# Patient Record
Sex: Female | Born: 1985
Health system: Southern US, Community
[De-identification: ages and names within clinical notes are randomized; demographics above are authoritative.]

## PROBLEM LIST (undated history)

## (undated) DIAGNOSIS — T7840XA Allergy, unspecified, initial encounter: Secondary | ICD-10-CM

## (undated) DIAGNOSIS — Z803 Family history of malignant neoplasm of breast: Secondary | ICD-10-CM

## (undated) HISTORY — DX: Family history of malignant neoplasm of breast: Z80.3

## (undated) HISTORY — DX: Allergy, unspecified, initial encounter: T78.40XA

## (undated) HISTORY — PX: WISDOM TOOTH EXTRACTION: SHX21

---
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
## 2009-06-27 ENCOUNTER — Encounter: Payer: Self-pay | Admitting: Internal Medicine

## 2009-06-30 DIAGNOSIS — J45909 Unspecified asthma, uncomplicated: Secondary | ICD-10-CM

## 2010-06-13 ENCOUNTER — Telehealth (INDEPENDENT_AMBULATORY_CARE_PROVIDER_SITE_OTHER): Payer: Self-pay | Admitting: *Deleted

## 2010-10-03 NOTE — Progress Notes (Signed)
Summary: cold  Phone Note Call from Patient   Caller: Patient Call For: katie Summary of Call: Pt c/o of possible cold, runny nose, sore throat. Have taken clarinex. Initial call taken by: Darletta Moll,  June 13, 2010 2:50 PM  Follow-up for Phone Call        Per CDY-suggest patient use Neti-pot, Sudafed PE, and Mucinex OTC.Reynaldo Minium CMA  June 13, 2010 5:20 PM   Pt is aware of the recs.Reynaldo Minium CMA  June 13, 2010 5:20 PM

## 2011-07-05 ENCOUNTER — Inpatient Hospital Stay (INDEPENDENT_AMBULATORY_CARE_PROVIDER_SITE_OTHER)
Admission: RE | Admit: 2011-07-05 | Discharge: 2011-07-05 | Disposition: A | Discharge status: Home / Self Care | Payer: 59 | Source: Ambulatory Visit | Attending: Family Medicine | Admitting: Family Medicine

## 2011-07-05 DIAGNOSIS — J029 Acute pharyngitis, unspecified: Secondary | ICD-10-CM

## 2011-10-10 ENCOUNTER — Telehealth: Payer: Self-pay | Admitting: *Deleted

## 2011-10-10 MED ORDER — AMOXICILLIN-POT CLAVULANATE 875-125 MG PO TABS: 1.0000 | ORAL_TABLET | Freq: Two times a day (BID) | ORAL | Status: AC

## 2011-10-10 MED ORDER — NEOMYCIN-POLYMYXIN-HC 3.5-10000-1 OT SOLN: 3.0000 [drp] | Freq: Three times a day (TID) | OTIC | Status: AC

## 2011-10-10 NOTE — Telephone Encounter (Signed)
Per SN---ok to add pt to schedule to establish care with SN.  She is aware that SN has sent in augmentin 875mg   1 po bid #20 and ear drops for her ear infection.  And she is also aware to take the align daily while on the augmentin.  appt has been made for the pt for 2-7 at 11 and pt is aware.

## 2011-10-11 ENCOUNTER — Encounter: Payer: Self-pay | Admitting: Pulmonary Disease

## 2011-10-11 ENCOUNTER — Ambulatory Visit (INDEPENDENT_AMBULATORY_CARE_PROVIDER_SITE_OTHER): Payer: 59 | Admitting: Pulmonary Disease

## 2011-10-11 VITALS — BP 106/68 | HR 79 | Temp 97.4°F | Ht 64.0 in | Wt 141.4 lb

## 2011-10-11 DIAGNOSIS — J309 Allergic rhinitis, unspecified: Secondary | ICD-10-CM | POA: Insufficient documentation

## 2011-10-11 DIAGNOSIS — J45909 Unspecified asthma, uncomplicated: Secondary | ICD-10-CM

## 2011-10-11 DIAGNOSIS — H6690 Otitis media, unspecified, unspecified ear: Secondary | ICD-10-CM

## 2011-10-11 NOTE — Progress Notes (Signed)
Subjective:     Patient ID: Rachael [name], female   DOB: 08-17-1986, 26 y.o.   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  ~  October 11, 2011:  Rachael [name] presents w/ a recent URI characterized by upper resp congestion & drainage w/ sneezing/ blowing/ & coughing she says; treated w/ OTC meds & improved; then several days ago noted onset left ear pain, pressure, sl dizzy, then decr hearing etc; no ear drainage, no blood, denies f/c/s; sinuses sl congested as noted & she was using Zyrtek, Tylenol cold & sinus, etc;  Exam shows red inflammed bulging left TM c/w OM and we decided to treat w/ Augmentin, Cortisporin Otic, & Align...          PROBLEM LIST:    ALLERGIC RHINITIS >> on OTC Antihist as needed... ASTHMA >> on NEB w/ Xopenex as needed & PROAIR HFA as needed...  Rachael [name] has a hx of severe allergies and Asthma as a child beginning ~4y/o & she recalls mult hospitalizations & missed school;  She recalls allergy testing w/ reactions to "everything" & she is especially sens to cat dander;  She took allergy shots for 2 yrs betw [age] y/o and improved sufficiently to stop shots & regular meds around puberty;  In recent yrs she notes very little if any problems & estimates that she uses the Nebulizer once every 860mo and the Proair rescue inhaler maybe once every 60mo if she gets contact w/ an allergen or occas w/ exercise;  We discussed checking RAST/ IgE later if she would like to look into her allergy profile...  HEALTH MAINTENANCE: ~  GI:  No hx problems... ~  GYN:  [facility], good general health, takes BCPs under their direction... ~  Immunizations:  She gets the yearly Flu vaccine from [facility];  She will check her vaccination record & get a copy for [place]...   Past Medical History  Diagnosis Date  . Asthma     No past surgical history on file.   Outpatient Encounter Prescriptions as of 10/11/2011    Medication Sig Dispense Refill  . amoxicillin-clavulanate (AUGMENTIN) 875-125 MG per tablet Take 1 tablet by mouth 2 (two) times daily.  20 tablet  0  . neomycin-polymyxin-hydrocortisone (CORTISPORIN) otic solution Place 3 drops into the left ear 3 (three) times daily.  10 mL  0  . Probiotic Product (ALIGN) 4 MG CAPS Take 4 mg by mouth daily.        No Known Allergies   Family History  Problem Relation Age of Onset  . Breast cancer      both grandparents  . Diabetes      both parents    History   Social History  . Marital Status: Married    Spouse Name: Riki Rusk    Number of Children: 0  . Years of Education: N/A   Occupational History  . [employer]   Social History Main Topics  . Smoking status: Current Some Day Smoker    Types: Cigarettes  . Smokeless tobacco: Not on file  . Alcohol Use: Yes     social use  . Drug Use: No  . Sexually Active: Not on file   Other Topics Concern  . Not on file   Social History Narrative  . No narrative on file    Current Medications, Allergies, Past Medical History, Past Surgical History, Family History, and Social  History were reviewed in [place] Link electronic medical record.    Review of Systems    Constitutional:  Denies F/C/S, anorexia, unexpected weight change. HEENT:  No HA, visual changes, +earache on left, nasal symptoms, sore throat, hoarseness. Resp:  No cough, sputum, hemoptysis; no SOB, tightness, wheezing. Cardio:  No CP, palpit, DOE, orthopnea, edema. GI:  Denies N/V/D/C or blood in stool; no reflux, abd pain, distention, or gas. GU:  No dysuria, freq, urgency, hematuria, or flank pain. MS:  Denies joint pain, swelling, tenderness, or decr ROM; no neck pain, back pain, etc. Neuro:  No tremors, seizures, dizziness, syncope, weakness, numbness, gait abn. Skin:  No suspicious lesions or skin rash. Heme:  No adenopathy, bruising, bleeding. Psyche: Denies confusion, sleep disturbance,  hallucinations, anxiety, depression.   Objective:   Physical Exam    Vital Signs:  Reviewed...  General:  WD, WN, 26 y/o WF in NAD; alert & oriented; pleasant & cooperative... HEENT:  [place]/AT; Conjunctiva- pink, Sclera- nonicteric, EOM-wnl, PERRLA, Fundi-benign;  EACs- red on left, TM- left bulging red inflammed; NOSE-clear; THROAT-clear & wnl. Neck:  Supple w/ full ROM; no JVD; normal carotid impulses w/o bruits; no thyromegaly or nodules palpated; no lymphadenopathy. Chest:  Clear to P & A; without wheezes, rales, or rhonchi heard. Heart:  Regular Rhythm; norm S1 & S2 without murmurs, rubs, or gallops detected. Abdomen:  Soft & nontender- no guarding or rebound; normal bowel sounds; no organomegaly or masses palpated. Ext:  Normal ROM; without deformities or arthritic changes; no varicose veins, venous insuffic, or edema;  Pulses intact w/o bruits. Neuro:  CNs II-XII intact; motor testing normal; sensory testing normal; gait normal & balance OK. Derm:  No lesions noted; no rash etc. Lymph:  No cervical, supraclavicular, axillary, or inguinal adenopathy palpated.   RADIOLOGY DATA:  Reviewed in the EPIC EMR >> CXR 9/03 showed normal heart size, clear lungs w/ sl prom markings, mild scoliosis seen...  LABORATORY DATA:  Reviewed in the EPIC EMR >> no prev lab data avail in Epic, [facility], or [employer]...    Assessment:     OTITIS MEDIA>  Presentation & exam c/w OM & she has been started on Augmentin 875mg Bid, Cortisporin Otic drops, & Align...  ALLERGIC RHINITIS>  She does well w/ OTC Antihist prn & has not had severe allergy reaction in yrs (she knows to stay away from cats)...  ASTHMA>  She has NEBS w/ Xopenex & ProairHFA rescue but doesn't need any meds regularly and uses them Q3-36mo when needed; we reviewed further eval w/ f/u CXR, PFTs, Labs if & when needed...  Pos FamHx DM>  Both parents w/ DM (father diet controlled, mother on pills) & we discussed strategies to avoid  problems in future; Diet/ Exercise/ avoid carbs...     Plan:     Patient's Medications  New Prescriptions   No medications on file  Previous Medications   AMOXICILLIN-CLAVULANATE (AUGMENTIN) 875-125 MG PER TABLET    Take 1 tablet by mouth 2 (two) times daily.   NEOMYCIN-POLYMYXIN-HYDROCORTISONE (CORTISPORIN) OTIC SOLUTION    Place 3 drops into the left ear 3 (three) times daily.   PROBIOTIC PRODUCT (ALIGN) 4 MG CAPS    Take 4 mg by mouth daily.  Modified Medications   No medications on file  Discontinued Medications   No medications on file

## 2011-10-11 NOTE — Patient Instructions (Signed)
Today we updated your med list in our EPIC system...     We discussed the Augmentin, Cortisporin Otic, & Align therapy...  Let us know if your allergies or your asthma become a problem & you find that you are using your medications more often...  Call for any questions.Marland KitchenMarland Kitchen

## 2013-03-04 ENCOUNTER — Encounter: Payer: Self-pay | Admitting: Pulmonary Disease

## 2013-03-10 ENCOUNTER — Other Ambulatory Visit (INDEPENDENT_AMBULATORY_CARE_PROVIDER_SITE_OTHER): Payer: 59

## 2013-03-10 ENCOUNTER — Encounter: Payer: Self-pay | Admitting: Pulmonary Disease

## 2013-03-10 ENCOUNTER — Ambulatory Visit (INDEPENDENT_AMBULATORY_CARE_PROVIDER_SITE_OTHER): Payer: 59 | Admitting: Pulmonary Disease

## 2013-03-10 VITALS — BP 108/72 | HR 79 | Temp 98.7°F | Ht 64.0 in | Wt 158.2 lb

## 2013-03-10 DIAGNOSIS — R509 Fever, unspecified: Secondary | ICD-10-CM

## 2013-03-10 DIAGNOSIS — J029 Acute pharyngitis, unspecified: Secondary | ICD-10-CM

## 2013-03-10 DIAGNOSIS — J020 Streptococcal pharyngitis: Secondary | ICD-10-CM

## 2013-03-10 LAB — CBC WITH DIFFERENTIAL/PLATELET
Basophils Relative: 0.3 % (ref 0.0–3.0)
Eosinophils Absolute: 0.3 10*3/uL (ref 0.0–0.7)
Eosinophils Relative: 2.5 % (ref 0.0–5.0)
Lymphocytes Relative: 14.8 % (ref 12.0–46.0)
Neutrophils Relative %: 75.9 % (ref 43.0–77.0)
RBC: 4.4 Mil/uL (ref 3.87–5.11)
WBC: 12.2 10*3/uL — ABNORMAL HIGH (ref 4.5–10.5)

## 2013-03-10 MED ORDER — FIRST-DUKES MOUTHWASH MT SUSP: OROMUCOSAL | Status: DC

## 2013-03-10 MED ORDER — METHYLPREDNISOLONE ACETATE 80 MG/ML IJ SUSP
80.0000 mg | Freq: Once | INTRAMUSCULAR | Status: AC
  Administered 2013-03-10: 80 mg via INTRAMUSCULAR

## 2013-03-10 MED ORDER — CEPHALEXIN 500 MG PO CAPS: 500.0000 mg | ORAL_CAPSULE | Freq: Two times a day (BID) | ORAL | Status: DC

## 2013-03-10 NOTE — Progress Notes (Signed)
Subjective:     Patient ID: Rachael [name], female   DOB: April 26, 1986, 27 y.o.   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  ~  October 11, 2011:  Rachael [name] presents w/ a recent URI characterized by upper resp congestion & drainage w/ sneezing/ blowing/ & coughing she says; treated w/ OTC meds & improved; then several days ago noted onset left ear pain, pressure, sl dizzy, then decr hearing etc; no ear drainage, no blood, denies f/c/s; sinuses sl congested as noted & she was using Zyrtek, Tylenol cold & sinus, etc;  Exam shows red inflammed bulging left TM c/w OM and we decided to treat w/ Augmentin, Cortisporin Otic, & Align...  ~  March 10, 2013:  37mo ROV & add-on for acute pharyngitis> Rachael [name] was with family/ friends over the 4th of July holiday & noted onset sore throat, difficulty swallowing, & swollen glands in neck 2d ago; throat appeared red in mirror; he had Temp to 102 assoc chills & sweats;  Exam shows red throat, swollen tonsils w/ mild exud seen, pos bilat adenopathy;  Labs reveal Hg=13.3, WBC= 12,200 w/ 76% neutrophils, B-strep screen is pos...  We discussed strep throat & Rx w/ Depo80, Keflex500Bid, MMW, along w/ Chlorseptic, Tylenol/ aleve, etc...            PROBLEM LIST:    ALLERGIC RHINITIS >> on OTC Antihist as needed... ASTHMA >> on NEB w/ Xopenex as needed & PROAIR HFA as needed...  Rachael [name] has a hx of severe allergies and Asthma as a child beginning ~4y/o & she recalls mult hospitalizations & missed school;  She recalls allergy testing w/ reactions to "everything" & she is especially sens to cat dander;  She took allergy shots for 2 yrs betw [age] y/o and improved sufficiently to stop shots & regular meds around puberty;  In recent yrs she notes very little if any problems & estimates that she uses the Nebulizer once every 48mo and the Proair rescue inhaler maybe once every 63mo if she gets contact w/ an  allergen or occas w/ exercise;  We discussed checking RAST/ IgE later if she would like to look into her allergy profile...  HEALTH MAINTENANCE: ~  GI:  No hx problems... ~  GYN:  [facility], good general health, takes BCPs under their direction... ~  Immunizations:  She gets the yearly Flu vaccine from [facility];  She will check her vaccination record & get a copy for [place]...   Past Medical History  Diagnosis Date  . Asthma     History reviewed. No pertinent past surgical history.   Outpatient Encounter Prescriptions as of 03/10/2013  Medication Sig Dispense Refill  . cetirizine (ZYRTEC) 10 MG tablet Take 10 mg by mouth daily.      . cephALEXin (KEFLEX) 500 MG capsule Take 1 capsule (500 mg total) by mouth 2 (two) times daily.  14 capsule  0  . Diphenhyd-Hydrocort-Nystatin (FIRST-DUKES MOUTHWASH) SUSP 1 tsp gargle and swallow four times daily as needed  120 mL  1  . [DISCONTINUED] Probiotic Product (ALIGN) 4 MG CAPS Take 4 mg by mouth daily.      . [EXPIRED] methylPREDNISolone acetate (DEPO-MEDROL) injection 80 mg        No facility-administered encounter medications on file as of 03/10/2013.    No Known Allergies   Family History  Problem Relation Age of Onset  . Breast cancer  both grandparents  . Diabetes      both parents    History   Social History  . Marital Status: Married    Spouse Name: Riki Rusk    Number of Children: 0  . Years of Education: N/A   Occupational History  . [employer]   Social History Main Topics  . Smoking status: Current Some Day Smoker    Types: Cigarettes  . Smokeless tobacco: Not on file  . Alcohol Use: Yes     Comment: social use  . Drug Use: No  . Sexually Active: Not on file   Other Topics Concern  . Not on file   Social History Narrative  . No narrative on file    Current Medications, Allergies, Past Medical History, Past Surgical History, Family History, and Social History were reviewed in  [employer] [facility] record.    Review of Systems    Constitutional:  +F/C/S, denies anorexia, unexpected weight change. HEENT:  No HA, visual changes, nasal symptoms; +sore throat, hard to swallow, +swollen glands Resp:  No cough, sputum, hemoptysis; no SOB, tightness, wheezing. Cardio:  No CP, palpit, DOE, orthopnea, edema. GI:  Denies N/V/D/C or blood in stool; no reflux, abd pain, distention, or gas. GU:  No dysuria, freq, urgency, hematuria, or flank pain. MS:  Denies joint pain, swelling, tenderness, or decr ROM; no neck pain, back pain, etc. Neuro:  No tremors, seizures, dizziness, syncope, weakness, numbness, gait abn. Skin:  No suspicious lesions or skin rash. Heme:  No adenopathy, bruising, bleeding. Psyche: Denies confusion, sleep disturbance, hallucinations, anxiety, depression.   Objective:   Physical Exam    Vital Signs:  Reviewed...  General:  WD, WN, 27 y/o WF in NAD; alert & oriented; pleasant & cooperative... HEENT:  [place]/AT; Conjunctiva- pink, Sclera- nonicteric, EOM-wnl, PERRLA, Fundi-benign;  EACs-clear;  NOSE-clear; THROAT-red swollen tonsils w/ exud Neck:  Supple w/ full ROM; no JVD; normal carotid impulses w/o bruits; no thyromegaly or nodules palpated; +lymphadenopathy. Chest:  Clear to P & A; without wheezes, rales, or rhonchi heard. Heart:  Regular Rhythm; norm S1 & S2 without murmurs, rubs, or gallops detected. Abdomen:  Soft & nontender- no guarding or rebound; normal bowel sounds; no organomegaly or masses palpated. Ext:  Normal ROM; without deformities or arthritic changes; no varicose veins, venous insuffic, or edema;  Pulses intact w/o bruits. Neuro:  CNs II-XII intact; motor testing normal; sensory testing normal; gait normal & balance OK. Derm:  No lesions noted; no rash etc. Lymph:  No cervical, supraclavicular, axillary, or inguinal adenopathy palpated.  RADIOLOGY DATA:  Reviewed in the EPIC EMR >> CXR 9/03 showed normal heart size,  clear lungs w/ sl prom markings, mild scoliosis seen...  LABORATORY DATA:  Reviewed in the EPIC EMR >> no prev lab data avail in Epic, [employer], or [employer]...    Assessment:      STREP THROAT >> pos beta strep screen etc... Rx w/ Depo80, Keflex500Bid, MMW, and OTC Chlorseptic, Tylenol, etc...  ALLERGIC RHINITIS>  She does well w/ OTC Antihist prn & has not had severe allergy reaction in yrs (she knows to stay away from cats)...  ASTHMA>  She has NEBS w/ Xopenex & ProairHFA rescue but doesn't need any meds regularly and uses them Q3-67mo when needed; we reviewed further eval w/ f/u CXR, PFTs, Labs if & when needed...  Pos FamHx DM>  Both parents w/ DM (father diet controlled, mother on pills) & we discussed strategies to avoid problems  in future; Diet/ Exercise/ avoid carbs...     Plan:     Patient's Medications  New Prescriptions   CEPHALEXIN (KEFLEX) 500 MG CAPSULE    Take 1 capsule (500 mg total) by mouth 2 (two) times daily.   DIPHENHYD-HYDROCORT-NYSTATIN (FIRST-DUKES MOUTHWASH) SUSP    1 tsp gargle and swallow four times daily as needed  Previous Medications   CETIRIZINE (ZYRTEC) 10 MG TABLET    Take 10 mg by mouth daily.  Modified Medications   No medications on file  Discontinued Medications   PROBIOTIC PRODUCT (ALIGN) 4 MG CAPS    Take 4 mg by mouth daily.

## 2013-03-10 NOTE — Patient Instructions (Addendum)
Jenascia> your lab work indicates a strep throat...  We will treat the inflammation w/ a Depo shot... We will treat the streptococcus w/ Keflex 500mg  twice daily x7d... We will treat the sore throat w/ Magic Mouthwash- 1 tsp gargle 7 swallow up to 4 times daily as needed...  I rec that you take a probiotic like ALIGN & consider Activia yogurt while you are on the antibiotic...  You may also use Tylenol/ Aleve and chlorseptic spray as needed...  Call for any questions.Marland KitchenMarland Kitchen

## 2013-06-19 ENCOUNTER — Encounter: Payer: Self-pay | Admitting: Internal Medicine

## 2013-06-19 ENCOUNTER — Encounter: Payer: Self-pay | Admitting: *Deleted

## 2013-06-19 MED ORDER — LEVALBUTEROL HCL 0.63 MG/3ML IN NEBU: 1.0000 | INHALATION_SOLUTION | Freq: Four times a day (QID) | RESPIRATORY_TRACT | Status: DC | PRN

## 2013-06-19 NOTE — Telephone Encounter (Signed)
CY, Please advise if you are okay with sending Rx for Albuterol nebulizer for the patient. Thanks.

## 2013-06-19 NOTE — Addendum Note (Signed)
Addended by: Darrell Jewel on: 06/19/2013 03:43 PM   Modules accepted: Orders

## 2013-06-19 NOTE — Telephone Encounter (Signed)
Ok to script for new nebulizer compressor machine, and for either Xopenex 0.063 or albuterol 0.083, whichever she prefers, # 25, 1 4x daily as needed, ref prn

## 2013-06-22 ENCOUNTER — Ambulatory Visit: Payer: 59 | Admitting: Internal Medicine

## 2013-06-24 ENCOUNTER — Encounter: Payer: Self-pay | Admitting: Internal Medicine

## 2013-06-24 ENCOUNTER — Ambulatory Visit (INDEPENDENT_AMBULATORY_CARE_PROVIDER_SITE_OTHER): Payer: 59 | Admitting: Internal Medicine

## 2013-06-24 VITALS — BP 116/74 | HR 107 | Ht 64.0 in | Wt 153.0 lb

## 2013-06-24 DIAGNOSIS — J45909 Unspecified asthma, uncomplicated: Secondary | ICD-10-CM

## 2013-06-24 MED ORDER — FLUTICASONE FUROATE-VILANTEROL 100-25 MCG/INH IN AEPB: 1.0000 | INHALATION_SPRAY | Freq: Every day | RESPIRATORY_TRACT | Status: DC

## 2013-06-24 NOTE — Patient Instructions (Addendum)
Sample Breo Ellipta  Maintenance inhaler    1 puff then rinse mouth, once daily  Please try to stop smoking  Please let me know how you do with this

## 2013-06-24 NOTE — Assessment & Plan Note (Signed)
Mild intermittent astjma with exacerbation, associated with fall season, but without rhinitis or obvious reflux or infection. Plan- smoking cessation, maintenance LABA/ICS at least until season passes

## 2013-06-24 NOTE — Progress Notes (Signed)
06/24/13- 27 yoF smoker followed for mild intermittent asthma FOLLOWS FOR: last seen 2010; recent flare up x 2. For the past month, with Fall season, has had more wheezing and chest tightness with little or no rhinitis. Using nebulizer machine 45 times over the last month but rarely needing albuterol. Worse in the evening. Denies recognized postnasal drip or reflux. Smoking about one pack per month. Taking Zyrtec at night and Claritin in the morning. Not pregnant.   Parents living with no family history of asthma.  ROS-see HPI Constitutional:   No-   weight loss, night sweats, fevers, chills, fatigue, lassitude. HEENT:   No-  headaches, difficulty swallowing, tooth/dental problems, sore throat,       No-  sneezing, itching, ear ache, nasal congestion, post nasal drip,  CV:  No-   chest pain, orthopnea, PND, swelling in lower extremities, anasarca, dizziness, palpitations Resp: No-   shortness of breath with exertion or at rest.              No-   productive cough,  No non-productive cough,  No- coughing up of blood.              No-   change in color of mucus.  + wheezing.   Skin: No-   rash or lesions. GI:  No-   heartburn, indigestion, abdominal pain, nausea, vomiting, GU:  MS:  No-   joint pain or swelling.  . Neuro-     nothing unusual Psych:  No- change in mood or affect. No depression or anxiety.  No memory loss.  OBJ- Physical Exam General- Alert, Oriented, Affect-appropriate, Distress- none acute Skin- rash-none, lesions- none, excoriation- none Lymphadenopathy- none Head- atraumatic            Eyes- Gross vision intact, PERRLA, conjunctivae and secretions clear            Ears- Hearing, canals-normal            Nose- Clear, no-Septal dev, mucus, polyps, erosion, perforation             Throat- Mallampati II , mucosa clear , drainage- none, tonsils- atrophic Neck- flexible , trachea midline, no stridor , thyroid nl, carotid no bruit Chest - symmetrical excursion , unlabored        Heart/CV- RRR , no murmur , no gallop  , no rub, nl s1 s2                           - JVD- none , edema- none, stasis changes- none, varices- none           Lung- clear to P&A, wheeze- none, cough- none , dullness-none, rub- none           Chest wall-  Abd-  Br/ Gen/ Rectal- Not done, not indicated Extrem- cyanosis- none, clubbing, none, atrophy- none, strength- nl Neuro- grossly intact to observation

## 2013-09-03 NOTE — L&D Delivery Note (Signed)
Delivery Note At 4:56 AM a viable and healthy female was delivered via Vaginal, Spontaneous Delivery (Presentation: Left Occiput Anterior).  APGAR: 8, 8; weight .P   Placenta status: Intact, Spontaneous.  Cord: 3 vessels with the following complications: None.    Anesthesia: Epidural  Episiotomy: None Lacerations: 2nd degree perineal, L labial Suture Repair: 3.0 vicryl rapide Est. Blood Loss (mL): 500cc  Mom to postpartum.  Baby to Couplet care / Skin to Skin.  Bovard-Stuckert, Wajiha Versteeg 06/26/2014, 5:34 AM  Br/A+/Contra?/Tdap in PNC/RI  D/w parents r/b/a of circumcision for female infant, wish to proceed

## 2013-11-24 LAB — OB RESULTS CONSOLE GC/CHLAMYDIA
CHLAMYDIA, DNA PROBE: NEGATIVE
GC PROBE AMP, GENITAL: NEGATIVE

## 2013-11-24 LAB — OB RESULTS CONSOLE HEPATITIS B SURFACE ANTIGEN: HEP B S AG: NEGATIVE

## 2013-11-24 LAB — OB RESULTS CONSOLE RUBELLA ANTIBODY, IGM: Rubella: IMMUNE

## 2013-11-24 LAB — OB RESULTS CONSOLE RPR: RPR: NONREACTIVE

## 2013-11-24 LAB — OB RESULTS CONSOLE HIV ANTIBODY (ROUTINE TESTING): HIV: NONREACTIVE

## 2014-04-05 ENCOUNTER — Encounter: Payer: Self-pay | Admitting: Family

## 2014-04-05 ENCOUNTER — Ambulatory Visit (INDEPENDENT_AMBULATORY_CARE_PROVIDER_SITE_OTHER): Payer: 59 | Admitting: Family

## 2014-04-05 VITALS — BP 102/60 | HR 89 | Temp 98.1°F | Resp 16 | Ht 65.0 in | Wt 194.0 lb

## 2014-04-05 DIAGNOSIS — J45909 Unspecified asthma, uncomplicated: Secondary | ICD-10-CM

## 2014-04-05 NOTE — Progress Notes (Signed)
Subjective:    Patient ID: Rachael [name], female    DOB: 12-10-1985, 28 y.o.   MRN: [id number]  HPI  Ms. Tritch is a 28 yr old female who presents today to establish care. She was previously followed by Dr. Lenna Gilford.  Asthma- Reports that she has worse symptoms in spring and fall.  She use zyrtec prn.  Worse if around cats. Reports that in the last 1 week she has needed to use her rescue inhaler zero times.  Pregnancy- Due in the end of October October- and is expecting a boy.  Dr. Melba Coon is following her at  [facility].    Review of Systems  Constitutional:       + weight gain with pregnancy.  150 is her non-pregnant weight  HENT: Negative for hearing loss and rhinorrhea.   Eyes: Negative for visual disturbance.  Respiratory: Negative for cough and shortness of breath.   Cardiovascular: Negative for chest pain and leg swelling.  Gastrointestinal: Negative for nausea, vomiting and diarrhea.  Genitourinary: Negative for dysuria, hematuria and vaginal bleeding.  Musculoskeletal: Negative for arthralgias and joint swelling.  Skin: Negative for rash.  Neurological: Negative for headaches.  Hematological: Negative for adenopathy.  Psychiatric/Behavioral:       Denies depression/anxiety   Past Medical History  Diagnosis Date  . Asthma   . Allergy     History   Social History  . Marital Status: Married    Spouse Name: Ysidro Evert    Number of Children: 0  . Years of Education: N/A   Occupational History  . [employer]   Social History Main Topics  . Smoking status: Former Smoker    Types: Cigarettes  . Smokeless tobacco: Not on file  . Alcohol Use: Yes     Comment: social use  . Drug Use: No  . Sexual Activity: Not on file   Other Topics Concern  . Not on file   Social History Narrative   Lives with husband   1 dog   Expecting first child   Works at [employer]   Working on Museum/gallery exhibitions officer at [employer]    History  reviewed. No pertinent past surgical history.  Family History  Problem Relation Age of Onset  . Breast cancer      both grandparents  . Diabetes      both parents  . Cancer Maternal Grandmother     breast  . Diabetes Maternal Grandfather   . Cancer Paternal Grandmother     breast    No Known Allergies  Current Outpatient Prescriptions on File Prior to Visit  Medication Sig Dispense Refill  . cetirizine (ZYRTEC) 10 MG tablet Take 10 mg by mouth daily.      [name] levalbuterol (XOPENEX) 0.63 MG/3ML nebulizer solution Take 3 mLs (0.63 mg total) by nebulization every 6 (six) hours as needed for wheezing.  75 mL  12   No current facility-administered medications on file prior to visit.    BP 102/60  Pulse 89  Temp(Src) 98.1 F (36.7 C) (Oral)  Resp 16  Ht 5\' 5"  (1.651 m)  Wt 194 lb (87.998 kg)  BMI 32.28 kg/m2  SpO2 99%       Objective:   Physical Exam  Constitutional: She is oriented to person, place, and time. She appears well-developed and well-nourished. No distress.  HENT:  Head: Normocephalic and atraumatic.  Right Ear: Tympanic membrane and ear canal normal.  Left Ear: Tympanic membrane and ear canal  normal.  Cardiovascular: Normal rate and regular rhythm.   No murmur heard. Pulmonary/Chest: Effort normal and breath sounds normal. No respiratory distress. She has no wheezes. She has no rales. She exhibits no tenderness.  Abdominal:  gravid  Neurological: She is alert and oriented to person, place, and time.  Psychiatric: She has a normal mood and affect. Her behavior is normal. Judgment and thought content normal.          Assessment & Plan:

## 2014-04-05 NOTE — Patient Instructions (Signed)
Please schedule physical at your convenience. Welcome to [facility]!

## 2014-04-05 NOTE — Progress Notes (Signed)
Pre visit review using our clinic review tool, if applicable. No additional management support is needed unless otherwise documented below in the visit note. 

## 2014-04-05 NOTE — Assessment & Plan Note (Signed)
She is not currently on LABA/ICS.  Seems to be doing well with rare use of rescue inhaler.  Monitor.

## 2014-05-26 LAB — OB RESULTS CONSOLE GBS: GBS: NEGATIVE

## 2014-06-24 NOTE — H&P (Signed)
Rachael [name] is a 28 y.o. female G1P0 at 39+ for IOL.  Pt term andfavorable.  D/w pt POC with IOL.  +FM, no LOF, no VB, occ ctx.  Relatively uncoplicated PNC except fetal pyelectasis - stable peds to be made aware.   . Maternal Medical History:  Contractions: Frequency: irregular.   Perceived severity is moderate.    Fetal activity: Perceived fetal activity is normal.    Prenatal complications: no prenatal complications Prenatal Complications - Diabetes: none.    OB History   Grav Para Term Preterm Abortions TAB SAB Ect Mult Living   1             G1 present No abn pap No STDs  Past Medical History  Diagnosis Date  . Asthma   . Allergy    No past surgical history on file. Family History: family history includes Breast cancer in an other family member; Cancer in her maternal grandmother and paternal grandmother; Diabetes in her maternal grandfather and another family member. Social History:  reports that she has quit smoking. Her smoking use included Cigarettes. She smoked 0.00 packs per day. She does not have any smokeless tobacco history on file. She reports that she drinks alcohol. She reports that she does not use illicit drugs.CMA, married Meds PNV All NKDA   Prenatal Transfer Tool  Maternal Diabetes: No Genetic Screening: Normal Maternal Ultrasounds/Referrals: Normal Fetal Ultrasounds or other Referrals:  None Maternal Substance Abuse:  No Significant Maternal Medications:  None Significant Maternal Lab Results:  Lab values include: Group B Strep negative Other Comments:  fetal pyelectasis, stable  Review of Systems  Constitutional: Negative.   HENT: Negative.   Eyes: Negative.   Respiratory: Negative.   Cardiovascular: Negative.   Gastrointestinal: Negative.   Genitourinary: Negative.   Musculoskeletal: Negative.   Skin: Negative.   Neurological: Negative.   Psychiatric/Behavioral: Negative.       There were no vitals taken for this visit. Maternal  Exam:  Uterine Assessment: Contraction frequency is irregular.   Abdomen: Fundal height is appropriate for gestation.   Estimated fetal weight is 7.5-8.5#.   Fetal presentation: vertex  Introitus: Normal vulva. Normal vagina.  Pelvis: adequate for delivery.   Cervix: Cervix evaluated by digital exam.     Physical Exam  Constitutional: She is oriented to person, place, and time. She appears well-developed and well-nourished.  HENT:  Head: Normocephalic and atraumatic.  Cardiovascular: Normal rate and regular rhythm.   Respiratory: Effort normal and breath sounds normal. No respiratory distress. She has no wheezes.  GI: Soft. Bowel sounds are normal. She exhibits no distension. There is no tenderness.  Musculoskeletal: Normal range of motion.  Neurological: She is alert and oriented to person, place, and time.  Skin: Skin is warm and dry.  Psychiatric: She has a normal mood and affect. Her behavior is normal.    Prenatal labs: ABO, Rh:  A+ Antibody:  neg Rubella:  immune RPR:   NR HBsAg:   neg HIV:   neg GBS:   neg  Hgb 13.1/UrCx neg/GC neg/Chl neg/VI/First Tri Screen WNL/glucola 112/gbbs neg/CF neg  [place] nl anat, vtx, pyelectasis, nl anat  Tdap 03/26/14 Flu 06/02/14  Assessment/Plan:28yo G1 at 39+ for IOL given term/favorablr cervix Epidural or IV pain meds prn AROM/pitocin to sugment' Expect SVD  Bovard-Stuckert, Rachael [name] 06/24/2014, 9:36 PM

## 2014-06-25 ENCOUNTER — Inpatient Hospital Stay (HOSPITAL_COMMUNITY): Payer: 59 | Admitting: Anesthesiology

## 2014-06-25 ENCOUNTER — Encounter (HOSPITAL_COMMUNITY): Payer: 59 | Admitting: Anesthesiology

## 2014-06-25 ENCOUNTER — Inpatient Hospital Stay (HOSPITAL_COMMUNITY)
Admission: AD | Admit: 2014-06-25 | Discharge: 2014-06-28 | DRG: 775 | Disposition: A | Discharge status: Home / Self Care | Payer: 59 | Source: Ambulatory Visit | Attending: Obstetrics and Gynecology | Admitting: Obstetrics and Gynecology

## 2014-06-25 ENCOUNTER — Encounter (HOSPITAL_COMMUNITY): Payer: Self-pay | Admitting: *Deleted

## 2014-06-25 DIAGNOSIS — Z87891 Personal history of nicotine dependence: Secondary | ICD-10-CM | POA: Diagnosis not present

## 2014-06-25 DIAGNOSIS — Z833 Family history of diabetes mellitus: Secondary | ICD-10-CM | POA: Diagnosis not present

## 2014-06-25 DIAGNOSIS — Z3403 Encounter for supervision of normal first pregnancy, third trimester: Secondary | ICD-10-CM

## 2014-06-25 DIAGNOSIS — Z3A39 39 weeks gestation of pregnancy: Secondary | ICD-10-CM | POA: Diagnosis present

## 2014-06-25 DIAGNOSIS — O9989 Other specified diseases and conditions complicating pregnancy, childbirth and the puerperium: Secondary | ICD-10-CM | POA: Diagnosis present

## 2014-06-25 LAB — RPR

## 2014-06-25 LAB — CBC
HEMATOCRIT: 36.7 % (ref 36.0–46.0)
HEMOGLOBIN: 12.2 g/dL (ref 12.0–15.0)
MCH: 30.2 pg (ref 26.0–34.0)
MCHC: 33.2 g/dL (ref 30.0–36.0)
MCV: 90.8 fL (ref 78.0–100.0)
Platelets: 228 10*3/uL (ref 150–400)
RBC: 4.04 MIL/uL (ref 3.87–5.11)
RDW: 13.3 % (ref 11.5–15.5)
WBC: 10.2 10*3/uL (ref 4.0–10.5)

## 2014-06-25 LAB — TYPE AND SCREEN
ABO/RH(D): A POS
Antibody Screen: NEGATIVE

## 2014-06-25 LAB — ABO/RH: ABO/RH(D): A POS

## 2014-06-25 MED ORDER — TERBUTALINE SULFATE 1 MG/ML IJ SOLN: 0.2500 mg | Freq: Once | INTRAMUSCULAR | Status: AC | PRN

## 2014-06-25 MED ORDER — CITRIC ACID-SODIUM CITRATE 334-500 MG/5ML PO SOLN: 30.0000 mL | ORAL | Status: DC | PRN

## 2014-06-25 MED ORDER — LACTATED RINGERS IV SOLN
500.0000 mL | Freq: Once | INTRAVENOUS | Status: AC
  Administered 2014-06-25: 500 mL via INTRAVENOUS

## 2014-06-25 MED ORDER — LIDOCAINE HCL (PF) 1 % IJ SOLN
30.0000 mL | INTRAMUSCULAR | Status: AC | PRN
  Administered 2014-06-25 (×2): 5 mL via SUBCUTANEOUS

## 2014-06-25 MED ORDER — OXYTOCIN 40 UNITS IN LACTATED RINGERS INFUSION - SIMPLE MED
62.5000 mL/h | INTRAVENOUS | Status: DC
  Administered 2014-06-26: 62.5 mL/h via INTRAVENOUS

## 2014-06-25 MED ORDER — OXYTOCIN 40 UNITS IN LACTATED RINGERS INFUSION - SIMPLE MED
1.0000 m[IU]/min | INTRAVENOUS | Status: DC
  Administered 2014-06-25: 2 m[IU]/min via INTRAVENOUS
  Filled 2014-06-25: qty 1000

## 2014-06-25 MED ORDER — DIPHENHYDRAMINE HCL 50 MG/ML IJ SOLN: 12.5000 mg | INTRAMUSCULAR | Status: DC | PRN

## 2014-06-25 MED ORDER — LACTATED RINGERS IV SOLN
INTRAVENOUS | Status: DC
  Administered 2014-06-25: 09:00:00 via INTRAVENOUS

## 2014-06-25 MED ORDER — EPHEDRINE 5 MG/ML INJ
10.0000 mg | INTRAVENOUS | Status: DC | PRN
  Filled 2014-06-25: qty 2

## 2014-06-25 MED ORDER — BUTORPHANOL TARTRATE 1 MG/ML IJ SOLN: 1.0000 mg | INTRAMUSCULAR | Status: DC | PRN

## 2014-06-25 MED ORDER — LACTATED RINGERS IV SOLN: 500.0000 mL | INTRAVENOUS | Status: DC | PRN

## 2014-06-25 MED ORDER — PHENYLEPHRINE 40 MCG/ML (10ML) SYRINGE FOR IV PUSH (FOR BLOOD PRESSURE SUPPORT)
80.0000 ug | PREFILLED_SYRINGE | INTRAVENOUS | Status: DC | PRN
  Filled 2014-06-25: qty 2
  Filled 2014-06-25: qty 10

## 2014-06-25 MED ORDER — OXYCODONE-ACETAMINOPHEN 5-325 MG PO TABS: 2.0000 | ORAL_TABLET | ORAL | Status: DC | PRN

## 2014-06-25 MED ORDER — ACETAMINOPHEN 325 MG PO TABS: 650.0000 mg | ORAL_TABLET | ORAL | Status: DC | PRN

## 2014-06-25 MED ORDER — OXYCODONE-ACETAMINOPHEN 5-325 MG PO TABS: 1.0000 | ORAL_TABLET | ORAL | Status: DC | PRN

## 2014-06-25 MED ORDER — FENTANYL 2.5 MCG/ML BUPIVACAINE 1/10 % EPIDURAL INFUSION (WH - ANES)
14.0000 mL/h | INTRAMUSCULAR | Status: DC | PRN
  Administered 2014-06-25 – 2014-06-26 (×2): 14 mL/h via EPIDURAL
  Filled 2014-06-25 (×2): qty 125

## 2014-06-25 MED ORDER — ONDANSETRON HCL 4 MG/2ML IJ SOLN: 4.0000 mg | Freq: Four times a day (QID) | INTRAMUSCULAR | Status: DC | PRN

## 2014-06-25 MED ORDER — PHENYLEPHRINE 40 MCG/ML (10ML) SYRINGE FOR IV PUSH (FOR BLOOD PRESSURE SUPPORT)
80.0000 ug | PREFILLED_SYRINGE | INTRAVENOUS | Status: DC | PRN
  Filled 2014-06-25: qty 2

## 2014-06-25 MED ORDER — OXYTOCIN BOLUS FROM INFUSION: 500.0000 mL | INTRAVENOUS | Status: DC

## 2014-06-25 NOTE — Progress Notes (Signed)
Patient ID: Rachael [name], female   DOB: 03/29/1986, 28 y.o.   MRN: [id number]  Comfortable with epidural, no c/o's  AFVSS gen NAD FHTs 125, good var, category 1 toco q45min, irr  SVE 6/90/0  Possible augmentation with pitocin.

## 2014-06-25 NOTE — Anesthesia Procedure Notes (Signed)
Epidural Patient location during procedure: OB Start time: 06/25/2014 8:56 PM End time: 06/25/2014 9:08 PM  Staffing Anesthesiologist: Takia Runyon, CHRIS Performed by: anesthesiologist   Preanesthetic Checklist Completed: patient identified, surgical consent, pre-op evaluation, timeout performed, IV checked, risks and benefits discussed and monitors and equipment checked  Epidural Patient position: sitting Prep: site prepped and draped and DuraPrep Patient monitoring: heart rate, cardiac monitor, continuous pulse ox and blood pressure Approach: midline Location: L4-L5 Injection technique: LOR saline  Needle:  Needle type: Tuohy  Needle gauge: 17 G Needle length: 9 cm Needle insertion depth: 7 cm Catheter type: closed end flexible Catheter size: 19 Gauge Catheter at skin depth: 13 cm Test dose: Other  Assessment Events: blood not aspirated, injection not painful, no injection resistance, negative IV test and no paresthesia  Additional Notes H+P and labs checked, risks and benefits discussed with the patient, consent obtained, procedure tolerated well and without complications.  Reason for block:procedure for pain

## 2014-06-25 NOTE — Progress Notes (Signed)
Patient ID: Rachael [name], female   DOB: 08/16/1986, 28 y.o.   MRN: [id number]  Getting uncomfortable with ctx.  +FM  AFVSS  gen NAD FHTs 125 R, category 1, + scalp stim q 53min  SVE 5/90/0-+1  Continue current mgmt

## 2014-06-25 NOTE — Progress Notes (Signed)
Patient ID: Rachael [name], female   DOB: 1986/06/28, 28 y.o.   MRN: [id number]  Comfortable.  No c/o's  AFVSS gen NAD FHT's 140 good var, category 1 toco occ  AROM for clear fluid w/o diff/comp  SVE 4/80/-1  Continue current mgmt

## 2014-06-25 NOTE — Anesthesia Preprocedure Evaluation (Signed)
Anesthesia Evaluation  Patient identified by MRN, date of birth, ID band Patient awake    Reviewed: Allergy & Precautions, H&P , NPO status , Patient's Chart, lab work & pertinent test results  History of Anesthesia Complications Negative for: history of anesthetic complications  Airway Mallampati: III TM Distance: >3 FB Neck ROM: Full    Dental  (+) Teeth Intact   Pulmonary neg shortness of breath, asthma , neg recent URI, former smoker,          Cardiovascular negative cardio ROS  Rhythm:Regular     Neuro/Psych negative neurological ROS  negative psych ROS   GI/Hepatic negative GI ROS, Neg liver ROS,   Endo/Other  negative endocrine ROS  Renal/GU negative Renal ROS     Musculoskeletal   Abdominal   Peds  Hematology negative hematology ROS (+)   Anesthesia Other Findings   Reproductive/Obstetrics (+) Pregnancy                           Anesthesia Physical Anesthesia Plan  ASA: II  Anesthesia Plan: Epidural   Post-op Pain Management:    Induction:   Airway Management Planned:   Additional Equipment:   Intra-op Plan:   Post-operative Plan:   Informed Consent: I have reviewed the patients History and Physical, chart, labs and discussed the procedure including the risks, benefits and alternatives for the proposed anesthesia with the patient or authorized representative who has indicated his/her understanding and acceptance.     Plan Discussed with: Anesthesiologist  Anesthesia Plan Comments:         Anesthesia Quick Evaluation

## 2014-06-26 ENCOUNTER — Encounter (HOSPITAL_COMMUNITY): Payer: Self-pay | Admitting: Obstetrics and Gynecology

## 2014-06-26 MED ORDER — BENZOCAINE-MENTHOL 20-0.5 % EX AERO: 1.0000 "application " | INHALATION_SPRAY | CUTANEOUS | Status: DC | PRN

## 2014-06-26 MED ORDER — SENNOSIDES-DOCUSATE SODIUM 8.6-50 MG PO TABS
2.0000 | ORAL_TABLET | ORAL | Status: DC
  Administered 2014-06-27 (×2): 2 via ORAL
  Filled 2014-06-26 (×2): qty 2

## 2014-06-26 MED ORDER — ONDANSETRON HCL 4 MG/2ML IJ SOLN: 4.0000 mg | INTRAMUSCULAR | Status: DC | PRN

## 2014-06-26 MED ORDER — LIDOCAINE HCL (PF) 1 % IJ SOLN
INTRAMUSCULAR | Status: AC
  Administered 2014-06-26: 30 mL
  Filled 2014-06-26: qty 30

## 2014-06-26 MED ORDER — ZOLPIDEM TARTRATE 5 MG PO TABS: 5.0000 mg | ORAL_TABLET | Freq: Every evening | ORAL | Status: DC | PRN

## 2014-06-26 MED ORDER — LACTATED RINGERS IV SOLN: INTRAVENOUS | Status: DC

## 2014-06-26 MED ORDER — LANOLIN HYDROUS EX OINT: TOPICAL_OINTMENT | CUTANEOUS | Status: DC | PRN

## 2014-06-26 MED ORDER — IBUPROFEN 600 MG PO TABS
600.0000 mg | ORAL_TABLET | Freq: Four times a day (QID) | ORAL | Status: DC
  Administered 2014-06-26 – 2014-06-28 (×10): 600 mg via ORAL
  Filled 2014-06-26 (×10): qty 1

## 2014-06-26 MED ORDER — WITCH HAZEL-GLYCERIN EX PADS: 1.0000 "application " | MEDICATED_PAD | CUTANEOUS | Status: DC | PRN

## 2014-06-26 MED ORDER — DIBUCAINE 1 % RE OINT
1.0000 | TOPICAL_OINTMENT | RECTAL | Status: DC | PRN
Start: 2014-06-26 — End: 2014-06-28

## 2014-06-26 MED ORDER — PRENATAL MULTIVITAMIN CH
1.0000 | ORAL_TABLET | Freq: Every day | ORAL | Status: DC
  Administered 2014-06-26 – 2014-06-28 (×3): 1 via ORAL
  Filled 2014-06-26 (×3): qty 1

## 2014-06-26 MED ORDER — ALBUTEROL SULFATE (2.5 MG/3ML) 0.083% IN NEBU: 3.0000 mL | INHALATION_SOLUTION | Freq: Four times a day (QID) | RESPIRATORY_TRACT | Status: DC | PRN

## 2014-06-26 MED ORDER — SIMETHICONE 80 MG PO CHEW: 80.0000 mg | CHEWABLE_TABLET | ORAL | Status: DC | PRN

## 2014-06-26 MED ORDER — OXYCODONE-ACETAMINOPHEN 5-325 MG PO TABS
1.0000 | ORAL_TABLET | ORAL | Status: DC | PRN
Start: 2014-06-26 — End: 2014-06-28

## 2014-06-26 MED ORDER — LIDOCAINE HCL 1 % IJ SOLN
30.0000 mL | Freq: Once | INTRAMUSCULAR | Status: DC
  Filled 2014-06-26: qty 30

## 2014-06-26 MED ORDER — ONDANSETRON HCL 4 MG PO TABS: 4.0000 mg | ORAL_TABLET | ORAL | Status: DC | PRN

## 2014-06-26 MED ORDER — OXYCODONE-ACETAMINOPHEN 5-325 MG PO TABS: 2.0000 | ORAL_TABLET | ORAL | Status: DC | PRN

## 2014-06-26 MED ORDER — DIPHENHYDRAMINE HCL 25 MG PO CAPS: 25.0000 mg | ORAL_CAPSULE | Freq: Four times a day (QID) | ORAL | Status: DC | PRN

## 2014-06-26 MED ORDER — LORATADINE 10 MG PO TABS
10.0000 mg | ORAL_TABLET | Freq: Every day | ORAL | Status: DC
  Administered 2014-06-28: 10 mg via ORAL
  Filled 2014-06-26 (×3): qty 1

## 2014-06-26 NOTE — Progress Notes (Signed)
Post Partum Day 0 Subjective: no complaints, up ad lib, tolerating PO and nl lochia, pain controlled  Objective: Blood pressure 103/69, pulse 88, temperature 98.6 F (37 C), temperature source Oral, resp. rate 18, height 5\' 4"  (1.626 m), weight 93.895 kg (207 lb), SpO2 98.00%, unknown if currently breastfeeding.  Physical Exam:  General: alert and no distress Lochia: appropriate Uterine Fundus: firm  Recent Labs  06/25/14 0915  HGB 12.2  HCT 36.7    Assessment/Plan: Breastfeeding and Lactation consult.  Routine care.     LOS: 1 day   Bovard-Stuckert, Lydia Toren 06/26/2014, 8:12 AM

## 2014-06-26 NOTE — Lactation Note (Signed)
This note was copied from the chart of [place]. Lactation Consultation Note  Patient Name: Rachael [name] DEYCX'K Date: 06/26/2014 Reason for consult: Initial assessment of this mom and baby at 17 hours postpartum.  Parents attended prenatal BF class at [facility] and mom states she has been shown how and is able to express colostrum as needed.  Baby asleep in open crib but has been STS frequently today.  Baby has had a void and several stools but only one sustained feeding with a LATCH score=6 due to both mom and baby needing assistance to latch and no swallows noted.  LC encouraged frequent STS and cue feedings, with use of hand pump or hand expression prior to latch attempts.  Mom encouraged to feed baby 8-12 times/24 hours and with feeding cues. LC encouraged review of Baby and Me pp 9, 14 and 20-25 for STS and BF information. LC provided [employer] brochure and reviewed [facility] services and list of community and web site resources.    Maternal Data Formula Feeding for Exclusion: No Has patient been taught Hand Expression?: Yes (documented by RN at several feedings today and mom says she is able to express some drops) Does the patient have breastfeeding experience prior to this delivery?: No (parents did attend [place] BF classes)  Feeding    LATCH Score/Interventions         LATCH scores of 5/6 due to need for latch assistance and mom with flat nipples (nurse has provided shells and hand pump)             Lactation Tools Discussed/Used   STS, hand expression, cue feedings, pre-pumping with hand pump  Consult Status Consult Status: Follow-up Date: 06/27/14 Follow-up type: In-patient    Junious Dresser [facility] 06/26/2014, 10:14 PM

## 2014-06-27 LAB — CBC
HCT: 30.3 % — ABNORMAL LOW (ref 36.0–46.0)
HEMOGLOBIN: 10.3 g/dL — AB (ref 12.0–15.0)
MCH: 30.8 pg (ref 26.0–34.0)
MCHC: 34 g/dL (ref 30.0–36.0)
MCV: 90.7 fL (ref 78.0–100.0)
Platelets: 176 10*3/uL (ref 150–400)
RBC: 3.34 MIL/uL — ABNORMAL LOW (ref 3.87–5.11)
RDW: 13.4 % (ref 11.5–15.5)
WBC: 10.6 10*3/uL — ABNORMAL HIGH (ref 4.0–10.5)

## 2014-06-27 NOTE — Progress Notes (Signed)
Post Partum Day 1 Subjective: no complaints, up ad lib, tolerating PO and nl lochia, pain controlled  Objective: Blood pressure 108/76, pulse 89, temperature 98.3 F (36.8 C), temperature source Oral, resp. rate 18, height 5\' 4"  (1.626 m), weight 93.895 kg (207 lb), SpO2 99.00%, unknown if currently breastfeeding.  Physical Exam:  General: alert and no distress Lochia: appropriate Uterine Fundus: firm   Recent Labs  06/25/14 0915 06/27/14 0603  HGB 12.2 10.3*  HCT 36.7 30.3*    Assessment/Plan: Plan for discharge tomorrow, Breastfeeding and Lactation consult.  Routine care.     LOS: 2 days   Rachael [name] 06/27/2014, 7:09 AM

## 2014-06-27 NOTE — Anesthesia Postprocedure Evaluation (Signed)
  Anesthesia Post-op Note  Patient: Rachael [name]  Procedure(s) Performed: * No procedures listed *  Patient Location: Mother/Baby  Anesthesia Type:Epidural  Level of Consciousness: awake and alert   Airway and Oxygen Therapy: Patient Spontanous Breathing  Post-op Pain: mild  Post-op Assessment: Post-op Vital signs reviewed, Patient's Cardiovascular Status Stable, Respiratory Function Stable, No signs of Nausea or vomiting, Pain level controlled, No headache, No residual numbness and No residual motor weakness  Post-op Vital Signs: Reviewed  Last Vitals:  Filed Vitals:   06/27/14 0536  BP: 108/76  Pulse: 89  Temp: 36.8 C  Resp:     Complications: No apparent anesthesia complications

## 2014-06-27 NOTE — Lactation Note (Signed)
This note was copied from the chart of [place]. Lactation Consultation Note  Mom states baby hold nipple in his mouth but doesn't suck.  Mom is pumping with DEBP and supplemented with formula.  Mom has volume parameters.  Instructed to call for assist with feeding cues.  Baby just finished formula supplementation.  Patient Name: Rachael [name]'G Date: 06/27/2014     Maternal Data    Feeding Feeding Type: Bottle Fed - Formula  LATCH Score/Interventions                      Lactation Tools Discussed/Used     Consult Status      Ave Filter 06/27/2014, 3:33 PM

## 2014-06-28 MED ORDER — PRENATAL MULTIVITAMIN CH: 1.0000 | ORAL_TABLET | Freq: Every day | ORAL | Status: DC

## 2014-06-28 MED ORDER — IBUPROFEN 800 MG PO TABS: 800.0000 mg | ORAL_TABLET | Freq: Three times a day (TID) | ORAL | Status: DC | PRN

## 2014-06-28 MED ORDER — OXYCODONE-ACETAMINOPHEN 5-325 MG PO TABS: 1.0000 | ORAL_TABLET | ORAL | Status: DC | PRN

## 2014-06-28 NOTE — Progress Notes (Signed)
Post Partum Day 2 Subjective: no complaints, up ad lib, voiding, tolerating PO and nl lochia, pain controlled  Objective: Blood pressure 118/85, pulse 82, temperature 98 F (36.7 C), temperature source Oral, resp. rate 18, height 5\' 4"  (1.626 m), weight 93.895 kg (207 lb), SpO2 100.00%, unknown if currently breastfeeding.  Physical Exam:  General: alert and no distress Lochia: appropriate Uterine Fundus: firm   Recent Labs  06/25/14 0915 06/27/14 0603  HGB 12.2 10.3*  HCT 36.7 30.3*    Assessment/Plan: Discharge home, Breastfeeding and Lactation consult.  Routine care - d/c with motrin, percocet and PNV.  F/u 6 weeks   LOS: 3 days   [name], Rachael Robbs 06/28/2014, 7:53 AM

## 2014-06-28 NOTE — Discharge Summary (Addendum)
Obstetric Discharge Summary Reason for Admission: induction of labor Prenatal Procedures: none Intrapartum Procedures: spontaneous vaginal delivery Postpartum Procedures: none Complications-Operative and Postpartum: 2nd degree perineal laceration, L labial Hemoglobin  Date Value Ref Range Status  06/27/2014 10.3* 12.0 - 15.0 g/dL Final     HCT  Date Value Ref Range Status  06/27/2014 30.3* 36.0 - 46.0 % Final    Physical Exam:  General: alert and no distress Lochia: appropriate Uterine Fundus: firm  Discharge Diagnoses: Term Pregnancy-delivered  Discharge Information: Date: 06/28/2014 Activity: pelvic rest Diet: routine Medications: PNV, Ibuprofen and Percocet Condition: stable Instructions: refer to practice specific booklet Discharge to: home Follow-up Information   Follow up with Bovard-Stuckert, Orlandus Borowski, MD. Schedule an appointment as soon as possible for a visit in 6 weeks. (routine postpartum check)    Specialty:  Obstetrics and Gynecology   Contact information:   [address] [address] [phone]       Newborn Data: Live born female  Birth Weight: 8 lb 2 oz (3685 g) APGAR: 9, 9  Home with mother.  Bovard-Stuckert, Sylvester Salonga 06/28/2014, 8:29 AM

## 2014-06-28 NOTE — Lactation Note (Signed)
This note was copied from the chart of [place]. Lactation Consultation Note RN reported baby was unable to latch and mom was supplementing w/bottle/formula. Mom hasn't worn her shells that was given to her. Spoke w/mom, mom states baby tongue thrust and will not stay latched and that she doesn't have anything really to give the baby from the breast. States when pumping to pull nipples out nothing comes out of breast. Massage breast, then hand expression demonstrated, noted small drop of colostrum noted, encouraged to rub on nipples. Explained to mom that she needs to roll nipples in her fingers and pull them out before latching. Demonstrated and applied shells inside of bra to help evert nipples more. Mom had just finished giving baby a bottle and baby was sound a sleep. Encouraged mom to call for [facility] for next feeding to assist in latching baby to breast. Discussed positioning, STS, stimulation to breast, colostrum and consistency.  Patient Name: Boy Marylouise Mallet TKPTW'S Date: 06/28/2014 Reason for consult: Difficult latch   Maternal Data    Feeding    LATCH Score/Interventions    Intervention(s): Skin to skin;Hand expression  Type of Nipple: Everted at rest and after stimulation (must roll and pull out or they are flat.) Intervention(s): Shells;Hand pump  Comfort (Breast/Nipple): Soft / non-tender           Lactation Tools Discussed/Used     Consult Status Consult Status: Follow-up Date: 06/28/14 Follow-up type: In-patient    Lorella Gomez, Elta [place] 06/28/2014, 3:07 AM

## 2014-06-28 NOTE — Lactation Note (Signed)
This note was copied from the chart of [place]. Lactation Consultation Note  Patient Name: Boy Keyundra Fant YSAYT'K Date: 06/28/2014 Reason for consult: Follow-up assessment;Difficult latch Mom reports baby is not latching to breast. Mom has been supplementing with formula, pumping occasionally.  Mom had started giving supplement just prior to [facility] visit. Baby sleepy but will wake. LC attempted to help baby latch at breast but baby could not sustain the latch. This baby sucks his upper lip, his upper lip will not stay flanged at the breast. He sucks his tongue and thrusts his tongue with initial suck exam but with suckling will develop a better suckling rhythm. Initiated #20 nipple shield and after few attempts baby did suckle few times but was not obtaining good depth and baby too sleepy, would not work with LC/Mom. [place] reviewed with Mom how to assess for deep latch using nipple shield if she tries the NS at home. Plan discussed with Mom is to try BF if baby will latch, look for breast milk in the nipple shield at the end of the feeding. Demonstrated how to pre-load nipple shield with curved tipped syringe to help with latch. Pump every 3 hours to encourage milk production, prevent engorgement and protect milk supply. Mom plans to purchase pump at d/c today. Encouraged to continue to supplement following supplemental guidelines per hours of age given to Mom. If baby does latch advised to keep at the breast for 15-20 minutes each breast if possible, but advised Mom not to worry, if she will protect her milk supply we can work on latch. OP f/u scheduled for Wednesday, 06/30/14 at 0900. Engorgement care reviewed if needed.   Maternal Data    Feeding Feeding Type: Breast Fed  LATCH Score/Interventions Latch: Repeated attempts needed to sustain latch, nipple held in mouth throughout feeding, stimulation needed to elicit sucking reflex. (initiated #20 nipple shield) Intervention(s): Adjust  position;Assist with latch;Breast massage;Breast compression  Audible Swallowing: None  Type of Nipple: Flat Intervention(s): Hand pump;Shells  Comfort (Breast/Nipple): Soft / non-tender     Hold (Positioning): Assistance needed to correctly position infant at breast and maintain latch. Intervention(s): Breastfeeding basics reviewed;Support Pillows;Position options;Skin to skin  LATCH Score: 5  Lactation Tools Discussed/Used     Consult Status Consult Status: Complete Date: 06/28/14 Follow-up type: In-patient    Katrine Coho 06/28/2014, 12:49 PM

## 2014-06-30 ENCOUNTER — Ambulatory Visit (HOSPITAL_COMMUNITY)
Admit: 2014-06-30 | Discharge: 2014-06-30 | Disposition: A | Discharge status: Home / Self Care | Payer: 59 | Attending: Obstetrics and Gynecology | Admitting: Obstetrics and Gynecology

## 2014-06-30 ENCOUNTER — Encounter: Payer: Self-pay | Admitting: Medical

## 2014-06-30 ENCOUNTER — Ambulatory Visit (INDEPENDENT_AMBULATORY_CARE_PROVIDER_SITE_OTHER): Payer: 59 | Admitting: Medical

## 2014-06-30 VITALS — BP 123/83 | HR 87 | Temp 98.4°F

## 2014-06-30 DIAGNOSIS — H6990 Unspecified Eustachian tube disorder, unspecified ear: Secondary | ICD-10-CM | POA: Insufficient documentation

## 2014-06-30 DIAGNOSIS — H6980 Other specified disorders of Eustachian tube, unspecified ear: Secondary | ICD-10-CM | POA: Insufficient documentation

## 2014-06-30 DIAGNOSIS — H6982 Other specified disorders of Eustachian tube, left ear: Secondary | ICD-10-CM

## 2014-06-30 DIAGNOSIS — H9202 Otalgia, left ear: Secondary | ICD-10-CM | POA: Insufficient documentation

## 2014-06-30 MED ORDER — FLUTICASONE PROPIONATE 50 MCG/ACT NA SUSP: 2.0000 | Freq: Every day | NASAL | Status: DC

## 2014-06-30 MED ORDER — CEFDINIR 300 MG PO CAPS: 300.0000 mg | ORAL_CAPSULE | Freq: Two times a day (BID) | ORAL | Status: DC

## 2014-06-30 NOTE — Assessment & Plan Note (Signed)
This may be only eustachian tube dysfunction. Pain she is describing seems a little bit out of character for only eustachian tube dysfunction. If no response to fluticasone and worsening toward weekend then did make antibiotic available for possible early om. But advised pt not to use antibiotic unless worsens as advised.

## 2014-06-30 NOTE — Patient Instructions (Addendum)
You appear to have early allergies vs uri with eustachian tube dysfunction. For your nasal congestion you could use either netty pot or fluticasone nasal spray(Rx advisement given). If your ear pain does not improve then start cefdinir.   Follow up in 7 days or as needed.  Rx advisement of flonase given. If uses only use for brief 7 day course. Wanted to decrease chance of antibiotic use. She may have only eustachian tube dysfunction but she is reporting significant pain in her ear. So I wanted to see if brief course flonase  would decrease pressure in her ear tube and see if can get by without antibiotic. Risk and benefits of meds discussed with pt.

## 2014-06-30 NOTE — Assessment & Plan Note (Signed)
This may be present along with allergic rhinitis. Did rx fluticasone.

## 2014-06-30 NOTE — Progress Notes (Signed)
Pre visit review using our clinic review tool, if applicable. No additional management support is needed unless otherwise documented below in the visit note. 

## 2014-06-30 NOTE — Assessment & Plan Note (Addendum)
Rx of fluticasone.

## 2014-06-30 NOTE — Lactation Note (Signed)
Lactation Consult  Mother's reason for visit:  Baby won't latch  Visit Type:  Feeding assessment /difficult latch  Appointment Notes: confirmed  Consult:  Initial Lactation Consultant:  Rachael [name]  ________________________________________________________________________ Rachael [name] Name: Rachael [name]  Date of Birth: 06/26/2013  Pediatrician: Dr. Carlis [name] at [facility] pedis  Gender: female  Gestational Age: [redacted]w[redacted]d (At Birth)  Birth Weight: 8 lb 2 oz (3685 g)  Weight at Discharge: Weight: 7 lb 9 oz (3430 g) Date of Discharge: 06/28/2014  Filed Weights   06/26/14 0456 06/27/14 0000 06/28/14 0000  Weight: 8 lb 2 oz (3685 g) 7 lb 12.9 oz (3540 g) 7 lb 9 oz (3430 g)  Last weight taken from location outside of [facility]: 7-9 oz  Location:Hospital D/C in hospital , per mom F/U at Dr. Anell Barr is today at 1100 am. Weight today: 7-9.4 oz 3440 g    ________________________________________________________________________  Mother's Name: Rachael [name] Type of delivery:  Vaginal delivery  Breastfeeding Experience: 1st baby , difficult with latching  Maternal Medical Conditions:  Asthma  Maternal Medications:  PNV   ________________________________________________________________________  Breastfeeding History (Post Discharge)- per mom has tried different things , Nipple shield wasn't working,  latching attempts but baby won't stay latched. Milk came in full force last evening, but no engorgement as of yet .   Frequency of breastfeeding: attempting to latching 1st, no consistent with latching feeding a bottle. ( Rachael [name] )  Duration of feeding: 5-7 minutes  PUMPING - With a DEBP Medela , every 3-4 hours with 1-2 oz of EBM yield  Supplementing - with EBM and if not available formula ( Similiac 19 cal )    Infant Intake and Output Assessment  Voids:  8 or more  in 24 hrs.  Color:  Clear yellow Stools:  2 or more  in 24 hrs.  Color:  Owens [name] ( per mom has not stooled since yesterday Tuesday 10  /27 at 76 n ) , seems gasey   ________________________________________________________________________  Maternal Breast Assessment  Breast:  Full )( boarder line engorged lateral aspects )  Nipple:  Flat ( semi compressible areolas )  Pain level:  2 Pain interventions:  Expressed breast milk  _______________________________________________________________________ Feeding Assessment/Evaluation  Initial feeding assessment:  Infant's oral assessment:  Variance- noted a short labia frenulum , able to stretch upper lip with latch at the breast and with a Rachael [name] bottle well.                                                             Small indentation noted on the anterior midline of the tongue. Baby able to stretch his tongue over the gum line                                             Checked the baby's suck pattern with a gloved finger , baby has a tight seal on gloved finger , slight humping of the posterior  Portion of the tongue with sucking pattern. Also noted a high palate. ( due to moms flat nipples , has made the latch difficult.   Positioning:  Football Right breast  LATCH documentation:  Latch:  2 = Grasps breast easily, tongue down, lips flanged, rhythmical sucking.  Audible swallowing:  2 = Spontaneous and intermittent  Type of nipple:  1 = Flat ( semi compressible areola , ( released some milk and latch improved with depth )   Comfort (Breast/Nipple):  1 = Filling, red/small blisters or bruises, mild/mod discomfort  Hold (Positioning):  1 = Assistance needed to correctly position infant at breast and maintain latch  LATCH score:  7  Even though latch score was  A 7 , Baby sustained a deep latch with flanged lips for 10 mins  Attached assessment:  Shallow at 1st , eased down chin and with breast compressions improved   Lips flanged:  Yes.    Lips untucked:  Yes.    Suck assessment:  Nutritive  Tools:  None at  this latch  Instructed on use and cleaning of tool:  None   Pre-feed weight:  3440 g   (7  lb. 9 .4 oz.) Post-feed weight:  3456  g (7  lb. 9.9  oz.) Amount transferred:  16  ml Amount supplemented:  Not after this latch   Additional Feeding Assessment -   Infant's oral assessment:  Variance 9 see above )   Positioning:  Cross cradle Right breast  LATCH documentation:  Latch:  2 = Grasps breast easily, tongue down, lips flanged, rhythmical sucking.  Audible swallowing:  2 = Spontaneous and intermittent  Type of nipple:  1 = Flat 9 areola more compressible for a deeper latch )   Comfort (Breast/Nipple):  1 = Filling, red/small blisters or bruises, mild/mod discomfort  Hold (Positioning):  1 = Assistance needed to correctly position infant at breast and maintain latch  LATCH score:  7   Attached assessment:  Deep  Lips flanged:  Yes.    Lips untucked:  Yes.    Suck assessment:  Nutritive  Tools:  Pump Instructed on use and cleaning of tool:  Yes.    Pre-feed weight:  3456  g  (7  lb. 9.9 oz  oz.) Post-feed weight:  3470  g (7 lb. 10.4  oz.) Amount transferred:  14  ml Amount supplemented:  20 ml of EBM with a Rachael [name] nipple , ( [name] noted to open wide and lips flanged and lower lip un- tucked )    Total amount pumped post feed:  R24  ml    L48  ml  Total amount transferred:  30  ml Total supplement given:  69  Ml  Lactation Impression - Mom and dad have worked well as a team to feed the baby                                        - Mom has continued to attempt to latch at home, and when not latching, has been feeding with a Rachael [name] nipple , which actually has helped baby to stop sucking on his lips                                        - Milk is  in , and breast are full to boarder line engorged lateral areas of both breast                                        - Baby latched with shallow at 1st , improved by [facility] easing down on the baby's chin to improve depth.                                         - Baby noted to sustain latch, and multiply swallows noted , increased with breast compressions, breast softened after 2 latches on the same breast with using 2 different positions.                                        - LC showed mom why it is so important to have the areola compressible like a sandwich before latch, and to use breast compressions with latch to assist with depth.                    [place] Plan -    - F/u next Wednesday 11/4 at 1pm with Lactation - to reassess latch and weight check                                        - Mom - encouraged wrest , naps, plenty fluids , especially water , nutritious snacks and meals                                        - Feedings - every 2 1/2 - 3 hours and with feeding cues , ( discussed cluster feedings with growth spurts )                                        - Steps for latching - Breast massage , hand express,pre- pump if to full , Areola needs to be compressible like a sandwich for a deep latch , breast compressions with latch                                        - Always soften 1st breast well prior to latch 2nd breast and if [name] doesn't latch 2nd breast release down with hand expressing or pumping 5-7 mins                                        - Engorgement prevention and tx - Full breast = good sign , if greater than full heading towards firm engorged , ice 15- 20 mins , then the above steps for latching                  Important - Until [name] is  consistent with latching , supplement after feedings at the breast up to 30 ml .                                    - If [name] has a feeding when he isn't sluggish , latch both breast , practice at the breast improves latching.                 Praised mom and Dad for their efforts

## 2014-06-30 NOTE — Progress Notes (Signed)
Subjective:    Patient ID: Rachael [name], female    DOB: 1986-02-26, 28 y.o.   MRN: [id number]  HPI  Pt in with some congestion nasally for one day. Today some ear pressure in the am and can't hear on left side. No fever, no chills or bodyaches. Mild st/minimal. She describes left ear has moderate painful and uncomfortable.  No sneezing or itching eyes.  LMP- Pt just delivered this past Saturday. Delivered a boy.   Past Medical History  Diagnosis Date  . Asthma   . Allergy   . SVD (spontaneous vaginal delivery) 06/26/2014    History   Social History  . Marital Status: Married    Spouse Name: Ysidro Evert    Number of Children: 0  . Years of Education: N/A   Occupational History  . [employer]   Social History Main Topics  . Smoking status: Former Smoker    Types: Cigarettes  . Smokeless tobacco: Not on file  . Alcohol Use: Yes     Comment: social use  . Drug Use: No  . Sexual Activity: Yes   Other Topics Concern  . Not on file   Social History Narrative   Lives with husband   1 dog   Expecting first child   Works at [employer] ([place])   Working on Museum/gallery exhibitions officer at [employer]    Past Surgical History  Procedure Laterality Date  . Wisdom tooth extraction      Family History  Problem Relation Age of Onset  . Breast cancer      both grandparents  . Diabetes      both parents  . Cancer Maternal Grandmother     breast  . Diabetes Maternal Grandfather   . Cancer Paternal Grandmother     breast    No Known Allergies  Current Outpatient Prescriptions on File Prior to Visit  Medication Sig Dispense Refill  . albuterol (PROVENTIL HFA;VENTOLIN HFA) 108 (90 BASE) MCG/ACT inhaler Inhale 2 puffs into the lungs every 6 (six) hours as needed for wheezing or shortness of breath.      . cetirizine (ZYRTEC) 10 MG tablet Take 10 mg by mouth daily.      [name] ibuprofen (ADVIL,MOTRIN) 800 MG tablet Take 1 tablet (800 mg total)  by mouth every 8 (eight) hours as needed.  45 tablet  1  . Prenatal Vit-Fe Fumarate-FA (PRENATAL MULTIVITAMIN) TABS tablet Take 1 tablet by mouth daily at 12 noon.  30 tablet  12  . oxyCODONE-acetaminophen (PERCOCET/ROXICET) 5-325 MG per tablet Take 1-2 tablets by mouth every 4 (four) hours as needed (for pain scale less than 7).  15 tablet  0   No current facility-administered medications on file prior to visit.    BP 123/83  Pulse 87  Temp(Src) 98.4 F (36.9 C)       Review of Systems  Constitutional: Negative for fever, chills and fatigue.  HENT: Positive for congestion and ear pain. Negative for facial swelling, nosebleeds, postnasal drip, rhinorrhea, sinus pressure, sore throat, tinnitus and trouble swallowing.   Respiratory: Negative for cough, chest tightness and wheezing.   Cardiovascular: Negative for chest pain and palpitations.  Genitourinary: Negative.   Musculoskeletal: Negative.   Neurological: Negative for dizziness, syncope, facial asymmetry, weakness, numbness and headaches.  Hematological: Negative for adenopathy. Does not bruise/bleed easily.  Psychiatric/Behavioral: Negative.        Objective:   Physical Exam  General  Mental Status - Alert. General Appearance - Well  groomed. Not in acute distress.  Skin Rashes- No Rashes.  HEENT Head- Normal. Ear Auditory Canal - Left- Normal Right - Normal.Tympanic Membrane- Left- mild dull tm(mild faint red streak middlel. Right- Normal. Eye Sclera/Conjunctiva- Left- Normal. Right- Normal. Nose & Sinuses Nasal Mucosa- Left- Mild Boggy + Congested. Right- Boggy + Congested. Mouth & Throat Lips: Upper Lip- Normal: no dryness, cracking, pallor, cyanosis, or vesicular eruption. Lower Lip-Normal: no dryness, cracking, pallor, cyanosis or vesicular eruption. Buccal Mucosa- Bilateral- No Aphthous ulcers. Oropharynx- No Discharge or Erythema. Tonsils: Characteristics- Bilateral- No Erythema or Congestion.  Size/Enlargement- Bilateral- No enlargement. Discharge- bilateral-None.  Neck Neck- Supple. No Masses.   Chest and Lung Exam Auscultation: Breath Sounds:-Normal. CTA  Cardiovascular Auscultation:Rythm- Regular, Rate and Rhythm. Murmurs & Other Heart Sounds:Ausculatation of the heart reveal- No Murmurs.  Lymphatic Head & Neck General Head & Neck Lymphatics: Bilateral: Description- No Localized lymphadenopathy.         Assessment & Plan:

## 2014-07-05 ENCOUNTER — Encounter: Payer: Self-pay | Admitting: Medical

## 2014-07-07 ENCOUNTER — Ambulatory Visit (HOSPITAL_COMMUNITY)
Admission: RE | Admit: 2014-07-07 | Discharge: 2014-07-07 | Disposition: A | Discharge status: Home / Self Care | Payer: 59 | Source: Ambulatory Visit | Attending: Obstetrics and Gynecology | Admitting: Obstetrics and Gynecology

## 2014-07-07 NOTE — Lactation Note (Signed)
Lactation Consult  Mother's reason for visit: Follow up  Visit Type:  Follow up feeding assessment  Appointment Notes:  F/U feeding assessment and weight check  Consult:  Follow-Up Lactation Consultant:  Myer Haff  ________________________________________________________________________ Rachael [name] Name: Rachael [name] Date of Birth: 06/26/2013 Pediatrician: Dr. Elnita Maxwell  Gender: female Gestational Age: [redacted]w[redacted]d (At Birth) Birth Weight: 8 lb 2 oz (3685 g) Weight at Discharge: Weight: 7 lb 9 oz (3430 g)Date of Discharge: 06/28/2014 Filed Weights   06/26/14 0456 06/27/14 0000 06/28/14 0000  Weight: 8 lb 2 oz (3685 g) 7 lb 12.9 oz (3540 g) 7 lb 9 oz (3430 g)   Last weight taken from location outside of [facility]: 7-9.4 oz  Location:Pediatrician's office Weight today: 7-6.2 oz 3352 g ( weight checked x2 by Lactation Consultant)      [place] and mom surprised by weight loss. See LC Consult Impression after consult and eval. Below    ________________________________________________________________________  Mother's Name: Rachael [name] Type of delivery:  Vaginal  Breastfeeding Experience:  1 1/2 weeks with challenges at 1st due to difficult latch , having to bottle feed and pump,  Started latching last week , still having difficulty Maternal Medical Conditions:  Asthma , and allergy  Maternal Medications: PNV, and inhaler when necessary   ________________________________________________________________________  Breastfeeding History (Post Discharge)  Frequency of breastfeeding:every 2 3- hours  Duration of feeding:  1-2 mins , then supplement   Pumping - every 3-4 hours  1-2 oz each pumping total  Supplementing - with EBM after every feeding, except one feeding supplementing with formula ( moms desire due to making sure baby will take formula )   Infant Intake and Output Assessment  Voids:  10 plus  in 24 hrs.  Color:  Clear yellow ( large  wet at consult )  Stools:  4-5 in 24 hrs.  Color:  [name] with some seeds  ( LC questioned whether baby was getting to the  A lot of foremilk and not consistently to the Hindmilk )                                                                                                                 The Hindmilk would cause the stools to be yellow )   ________________________________________________________________________  Maternal Breast Assessment  Breast:  Full Nipple:  Erect Pain level:  0 Pain interventions:  Expressed breast milk Both breast are full with some lateral nodules  _______________________________________________________________________ Feeding Assessment/Evaluation  Initial feeding assessment:  Infant's oral assessment:  Variance noted a short labia frenulum , ( baby able to stretch upper lip well )                                              And short frenulum , able to stretch tongue over gum line well. Noted a High palate ( and due to moms  flat                                              nipples, although compressible areolas may not consistently greet the baby's palate)   Positioning:  Football Right breast  LATCH documentation:  Latch:  2 = Grasps breast easily, tongue down, lips flanged, rhythmical sucking.  Audible swallowing:  2 = Spontaneous and intermittent  Type of nipple:  1 = Flat ( areola compressible )   Comfort (Breast/Nipple):  1 = Filling, red/small blisters or bruises, mild/mod discomfort Breast full , not engorged )   Hold (Positioning):  1 = Assistance needed to correctly position infant at breast and maintain latch  LATCH score:  7  Mom challenges with obtaining depth and baby seems to get fussy when latching and tends to fight with hands and arms   Attached assessment:  Shallow ( LC assisted mom to obtain the depth )   Lips flanged:  Yes.    Lips untucked:  Yes.    Suck assessment:  Nutritive  Tools:  None at this latch  Instructed  on use and cleaning of tool:  No.  Pre-feed weight:  3352 g , 7-6.2 oz   Post-feed weight:  3380 g , 7-7.2 oz  Amount transferred:  28 ml  Amount supplemented:  None   Additional Feeding Assessment -   Infant's oral assessment:  Variance ( see above for details )   Positioning:  Football Right breast   Added a #24 Nipple shield with moms permission and seems to improve latch and depth   LATCH documentation:  Latch:  2 = Grasps breast easily, tongue down, lips flanged, rhythmical sucking.  Audible swallowing:  2 = Spontaneous and intermittent  Type of nipple:  1 = Flat  Comfort (Breast/Nipple):  1 = Filling, red/small blisters or bruises, mild/mod discomfort  Hold (Positioning):  1 = Assistance needed to correctly position infant at breast and maintain latch  LATCH score: 7   Attached assessment:  Deep  Lips flanged:  Yes.    Lips untucked:  Yes.    Suck assessment:  Nutritive  Tools:  Nipple shield 24 mm Instructed on use and cleaning of tool:  Yes.    Pre-feed weight:  3380 g , 7-7.2 oz  Post-feed weight:  3398 g , 7-7.8 oz  Amount transferred:  18 ml  Amount supplemented:  None   Additional Feeding with a #24 Nipple shield  Left breast  Latch = 9  Latched with depth , multiply swallows , and gulps , and sustained a deep latch with flanged lips .   Pre- weight - 3398 g , 7-7.8 oz  Post - weight - 3434 g , 7-9.1 oz  Volume transferred = 36 ml   Total volume transferred = 82 ml  Mom did not post pump   Lactation Impression - First of all , puzzled by the weight loss in the last week . Due to the shallow latch  Baby hasn't been getting much from the breast when latched , but per mom has been supplementing with a bottle with EBM and formula once a day. Up to 60 ml.  One big sign the baby hasn't consistently been getting to the foremilk is the greenish brown stools. Discussed this with mom. Reassured mom she has plenty of milk and alll her hard work  will pay off. See  LC plan below . Mom did express frustration with feedings.  Lactation Plan of care - Due to 82 ml of milk transferred off the breast at consult - F/U weight check and LC visit for next Wednesday 11/4   BF Goal - Weight gain for [name] , also to get [name] consistently to the Fatty Hindmilk. Option #1 - Feed [name] at the breast  With #42 Nipple shield , if to full to start 9 hand express 1/2 - 1 oz off 1st breast , save milk )                      Latch , offer 2nd breast , make sure lips are flanged open . Post pump 10 mins , save milk ( HInd milk )  Option #2 - Feed [name] with a Dr, Owens Shark nipple 2 1/2 = 3 oz of EBM . ( and per mom preference formula bottle once a day )                       Pump both breast for 15 -20 mins ( 1st pump 5 mins both breast ( stop , save milk = Foremilk ), then 10 -15 mins pump both for the Hindmilk - save                       Combine foremilk with the post pump after feeding from the breast.   Mom aware to supplement as needed to increase weight and to keep weight going upward.  Written plan given to mom and per mom understood plan.                                          Total amount pumped post feed: did not post pump   Total amount transferred:  82 ml  Total supplement given:  None at this consult

## 2014-07-14 ENCOUNTER — Ambulatory Visit (HOSPITAL_COMMUNITY)
Admission: RE | Admit: 2014-07-14 | Discharge: 2014-07-14 | Disposition: A | Discharge status: Home / Self Care | Payer: 59 | Source: Ambulatory Visit | Attending: Obstetrics and Gynecology | Admitting: Obstetrics and Gynecology

## 2014-07-14 NOTE — Lactation Note (Signed)
Lactation Consult  Mother's reason for visit:  FOLLOW UP, DIFFICULT LATCH Visit Type: FEEDING ASSESSMENT Appointment Notes:  NIPPLE SHIELD Consult:  Follow-Up Lactation Consultant:  Ave Filter  ________________________________________________________________________   Rachael [name] Name: Rachael [name] Date of Birth: 06/26/2013 Pediatrician: Carlis Abbott Gender: female Gestational Age: [redacted]w[redacted]d (At Birth) Birth Weight: 8 lb 2 oz (3685 g) Weight at Discharge: Weight: 7 lb 9 oz (3430 g)Date of Discharge: 06/28/2014 Filed Weights   06/26/14 0456 06/27/14 0000 06/28/14 0000  Weight: 8 lb 2 oz (3685 g) 7 lb 12.9 oz (3540 g) 7 lb 9 oz (3430 g)   Last weight taken from location outside of [facility]: 7-6 ON 07/07/2014 Location:Hospital Weight today:8-9.4     ________________________________________________________________________  Mother's Name: Rachael [name] Type of delivery:  VAGINAL Breastfeeding Experience:  DIFFICULT LATCH WITH FIRST Maternal Medical Conditions:  ASTHMA Maternal Medications:  PNV'S  ________________________________________________________________________  Breastfeeding History (Post Discharge)  Frequency of breastfeeding:  Every 2-3 hours Duration of feeding:  30 minutes  Supplementation  Formula:  Volume 132ml Frequency:  Once per day Total volume per day:  120ml       Breastmilk:  Volume 159ml Frequency:  Once per day Total volume per day:  152ml  Method:  Bottle,   Pumping  Type of pump: double electric Frequency:  2-3 times per day Volume:  60-42ml  Infant Intake and Output Assessment  Voids: 6+  in 24 hrs.  Color:  Clear yellow Stools:5-6  in 24 hrs.  Color:  Yellow  ________________________________________________________________________     _______________________________________________________________________ Feeding Assessment/Evaluation  Mom and 66 week old infant here for follow up weight check.  Baby  has gained 19 ounces in 7 days.  Mom states breastfeeding is going well using the 24 mm nipple shield.  She states she feels so much better and less stressed.  Baby recently ate and not acting hungry.  Mom states she doesn't feel the need for latch assist.  Reviewed techniques to use to wean baby from shield.  Baby noted to have thick white coating on the back of tongue.  Instructed mom in oral care for baby.  Mom has a pediatrician appointment this afternoon and will ask to have this checked to rule out thrush.  Instructions given for breast care if baby is treated.  Encouraged to call with concerns and attend support groups if possible.

## 2014-12-17 ENCOUNTER — Telehealth: Payer: Self-pay | Admitting: Genetic Counselor

## 2014-12-17 NOTE — Telephone Encounter (Signed)
GENETIC APPT-LEFT MESSAGE FOR PATIENT TO RETURN CALL TO SCHEDULE GENETIC APPT

## 2014-12-22 ENCOUNTER — Telehealth: Payer: Self-pay | Admitting: Genetic Counselor

## 2014-12-22 NOTE — Telephone Encounter (Signed)
genetic referral-s/w patient and gave appt for 05/26 @ 1:30 w/genetic counselor.  Referring Dr. Sandford Craze

## 2014-12-23 ENCOUNTER — Telehealth: Payer: Self-pay | Admitting: Internal Medicine

## 2014-12-23 NOTE — Telephone Encounter (Signed)
Rash started with trial of new facial wash.  Has erythematous/ papular rash on exposed face and neck Imp- contact and/ or photosensitivity rash from face wash product She hopes to be clear in 2 days for a wedding Plan- depomedrol, discontinue that product, avoid sun exposure

## 2015-01-27 ENCOUNTER — Other Ambulatory Visit: Payer: 59

## 2015-01-27 ENCOUNTER — Ambulatory Visit (HOSPITAL_BASED_OUTPATIENT_CLINIC_OR_DEPARTMENT_OTHER): Payer: 59 | Admitting: Genetic Counselor

## 2015-01-27 ENCOUNTER — Encounter: Payer: Self-pay | Admitting: Genetic Counselor

## 2015-01-27 DIAGNOSIS — Z315 Encounter for genetic counseling: Secondary | ICD-10-CM

## 2015-01-27 DIAGNOSIS — Z803 Family history of malignant neoplasm of breast: Secondary | ICD-10-CM | POA: Insufficient documentation

## 2015-01-27 NOTE — Progress Notes (Signed)
Patient Name: Rachael [name] Patient Age: 29 y.o. Encounter Date: 01/27/2015  Referring Physician: Lianne Bushy, MD  Primary Care Provider: Nance Pear., NP   Rachael [name], a 29 y.o. female, is being seen at the [facility] due to a family history of breast cancer. She presents to clinic today to discuss the possibility of a hereditary predisposition to cancer and discuss whether genetic testing is warranted.  HISTORY OF PRESENT ILLNESS: Rachael [name] has no personal history of cancer and is in generally good health. She stated that she has a yearly gynecologic exam and clinical breast exam.  Past Medical History  Diagnosis Date  . Asthma   . Allergy   . SVD (spontaneous vaginal delivery) 06/26/2014  . Family history of breast cancer     Past Surgical History  Procedure Laterality Date  . Wisdom tooth extraction      History   Social History  . Marital Status: Married    Spouse Name: Ysidro Evert  . Number of Children: 0  . Years of Education: N/A   Occupational History  . [employer]   Social History Main Topics  . Smoking status: Former Smoker    Types: Cigarettes  . Smokeless tobacco: Not on file  . Alcohol Use: Yes     Comment: social use  . Drug Use: No  . Sexual Activity: Yes   Other Topics Concern  . Not on file   Social History Narrative   Lives with husband   1 dog   Expecting first child   Works at [employer] ([place])   Working on Museum/gallery exhibitions officer at [employer]:   During the visit, a 4-generation pedigree was obtained. Family tree will be sent for scanning and will be in EPIC under the Media tab.  Significant diagnoses include the following:  Family History  Problem Relation Age of Onset  . Diabetes      both parents  . Breast cancer Maternal Grandmother     Dx 59s; deceased 26s  . Diabetes Maternal Grandfather   . Breast cancer Paternal Grandmother      Dx 37s; deceased 40s  . Breast cancer Other     pat grandmother's mother    Additionally, Rachael [name] has a son (36 months old) and a sister (age 31). Her mother is 61 and her father is 54 and both are cancer-free as are each of their sisters.   Rachael [name] ancestry is Caucasian. There is no known Jewish ancestry and no consanguinity.  ASSESSMENT AND PLAN: Rachael [name] is a 29 y.o. female with a family history of breast cancer as noted above. This history is not highly suggestive of a hereditary predisposition to cancer, but testing is warranted due to the young ages of onset of the breast cancers. We reviewed the characteristics, features and inheritance patterns of hereditary cancer syndromes. We discussed the process of testing, insurance coverage and implications of results. A negative result will be overall reassuring but would not inform the genetic status of others in her family, such as parents or sister, who may wish to get their own genetics evaluation.  Rachael [name] wished to pursue genetic testing and a blood sample will be sent to [facility] for analysis of the 17 genes on the BreastNext gene panel. We discussed the implications of a positive, negative and/ or Variant of Uncertain Significance (VUS) result. Results should be available in approximately 4 weeks, at which point  we will contact her and address implications for her as well as address genetic testing for at-risk family members, if needed.    We encouraged Rachael [name] to remain in contact with Cancer Genetics annually so that we can update the family history and inform her of any changes in cancer genetics and testing that may be of benefit for this family. Rachael [name] questions were answered to her satisfaction today.   Thank you for the referral and allowing [place] to share in the care of your patient.   The patient was seen for a total of 30 minutes, greater than 50% of which was spent face-to-face counseling. This patient was  discussed with the overseeing provider who agrees with the above.   Steele Berg, MS, [place] Certified Genetic Counselor phone: [phone] [email or web address]

## 2015-02-22 ENCOUNTER — Encounter: Payer: Self-pay | Admitting: Genetic Counselor

## 2015-02-22 DIAGNOSIS — Z1379 Encounter for other screening for genetic and chromosomal anomalies: Secondary | ICD-10-CM

## 2015-02-22 NOTE — Progress Notes (Signed)
GENETIC TEST RESULTS  Patient Name: Rachael [name] Patient Age: 29 y.o. Encounter Date: 02/22/2015  Referring Physician: Lianne Bushy, MD  Rachael [name] was called today to discuss genetic test results. Please see the Genetics note from her visit on 01/27/15 for a detailed discussion of her personal and family history.  GENETIC TESTING: At the time of Rachael [name]'s visit, we recommended she pursue genetic testing of multiple genes on the BreastNext gene panel. This test, which included sequencing and deletion/duplication analysis of 17 genes, was performed at [facility]. Testing was normal and did not reveal any clearly pathogenic mutation in these genes. The genes tested were ATM, BARD1, BRCA1, BRCA2, BRIP1, CDH1, CHEK2, MRE11A, MUTYH, NBN, NF1, PALB2, PTEN, RAD50, RAD51C, RAD51D, and TP53.  We discussed with Rachael [name] that since the current test is not perfect, it is possible there may be a gene mutation that current testing cannot detect, but that chance is small. We also discussed that it is possible that a different genetic factor, which was not part of this testing or has not yet been discovered, is responsible for the cancer diagnoses in the family. Should Rachael [name] wish to discuss or pursue this additional testing, we are happy to coordinate this at any time, but do not feel that she is at significant risk of harboring a mutation in a different gene.     Genetic testing did detect a Variant of Unknown Significance in the ATM gene called p.R2832H (c.[id number]). At this time, it is unknown if this variant is associated with increased cancer risk or if this is a normal finding, but most variants such as this get reclassified to being inconsequential. It should not be used to make medical management decisions. With time, we suspect the lab will determine the significance of this variant, if any. If we do learn more about it, we will try to contact Rachael [name] to discuss it further.  However, it is important to stay in touch with [place] periodically and keep the address and phone number up to date.  CANCER SCREENING: This result is reassuring and indicates that Rachael [name] does not likely have an increased risk of cancer due to a mutation in one of these genes. We recommended Rachael [name] continue to follow the cancer screening guidelines provided by her primary physician.   FAMILY MEMBERS: Women in the family are at some increased risk of developing cancer, over the general population risk, simply due to the family history. We recommended they have a yearly mammogram, a yearly clinical breast exam, and perform monthly breast self-exams. A gynecologic exam is recommended yearly. Colon cancer screening is recommended to begin by age 61 for men and women.  Family members should not pursue testing for the above VUS outside of a research setting as it has no implications for their medical management. Her sister may wish to get her own comprehensive testing as Rachael [name] results have no implications on her sister's genetic status.  Lastly, we discussed with Rachael [name] that cancer genetics is a rapidly advancing field and it is possible that new genetic tests will be appropriate for her in the future. We encouraged her to remain in contact with [place] on an annual basis so we can update her personal and family histories, and let her know of advances in cancer genetics that may benefit the family. Our contact number was provided. Rachael [name] questions were answered to her satisfaction today, and she knows she is welcome to call anytime with  additional questions.    Steele Berg, MS, [name] phone: [phone] [name]

## 2015-08-15 ENCOUNTER — Telehealth: Payer: Self-pay | Admitting: Internal Medicine

## 2015-08-15 MED ORDER — AZITHROMYCIN 250 MG PO TABS: ORAL_TABLET | ORAL | Status: DC

## 2015-08-15 NOTE — Telephone Encounter (Signed)
Acute sinusitis P- Z pak sent to [facility]

## 2015-09-27 MED FILL — ELINEST-28 TABLET: 0.3-30 | 70 days supply | Qty: 84 | Fill #4 | Status: TO

## 2015-10-14 ENCOUNTER — Telehealth: Payer: Self-pay | Admitting: Internal Medicine

## 2015-10-14 MED ORDER — OSELTAMIVIR PHOSPHATE 75 MG PO CAPS: ORAL_CAPSULE | ORAL | Status: DC

## 2015-10-14 MED FILL — OSELTAMIVIR PHOS 75 MG CAP: 75 | 5 days supply | Qty: 10 | Fill #0

## 2015-10-14 NOTE — Telephone Encounter (Signed)
Sleeping with child who has been diagnosed with flu She requests Tamiflu- sent to pharmacy

## 2015-11-29 MED FILL — ELINEST-28 TABLET: 0.3-30 | 70 days supply | Qty: 84 | Fill #0

## 2016-02-06 MED FILL — ELINEST-28 TABLET: 0.3-30 | 70 days supply | Qty: 84 | Fill #0

## 2016-02-24 DIAGNOSIS — Z1389 Encounter for screening for other disorder: Secondary | ICD-10-CM | POA: Diagnosis not present

## 2016-02-24 DIAGNOSIS — Z01419 Encounter for gynecological examination (general) (routine) without abnormal findings: Secondary | ICD-10-CM | POA: Diagnosis not present

## 2016-02-24 DIAGNOSIS — Z6825 Body mass index (BMI) 25.0-25.9, adult: Secondary | ICD-10-CM | POA: Diagnosis not present

## 2016-02-24 DIAGNOSIS — Z3041 Encounter for surveillance of contraceptive pills: Secondary | ICD-10-CM | POA: Diagnosis not present

## 2016-04-03 ENCOUNTER — Ambulatory Visit (INDEPENDENT_AMBULATORY_CARE_PROVIDER_SITE_OTHER): Payer: 59 | Admitting: Physician Assistant

## 2016-04-03 ENCOUNTER — Encounter: Payer: Self-pay | Admitting: Physician Assistant

## 2016-04-03 VITALS — BP 118/65 | HR 71 | Ht 65.0 in | Wt 159.0 lb

## 2016-04-03 DIAGNOSIS — J4520 Mild intermittent asthma, uncomplicated: Secondary | ICD-10-CM | POA: Diagnosis not present

## 2016-04-03 DIAGNOSIS — Z13220 Encounter for screening for lipoid disorders: Secondary | ICD-10-CM

## 2016-04-03 DIAGNOSIS — Z7689 Persons encountering health services in other specified circumstances: Secondary | ICD-10-CM

## 2016-04-03 DIAGNOSIS — Z7189 Other specified counseling: Secondary | ICD-10-CM

## 2016-04-03 DIAGNOSIS — E663 Overweight: Secondary | ICD-10-CM | POA: Insufficient documentation

## 2016-04-03 DIAGNOSIS — Z131 Encounter for screening for diabetes mellitus: Secondary | ICD-10-CM | POA: Diagnosis not present

## 2016-04-03 DIAGNOSIS — Z8041 Family history of malignant neoplasm of ovary: Secondary | ICD-10-CM

## 2016-04-03 MED ORDER — PHENTERMINE HCL 37.5 MG PO TABS: 37.5000 mg | ORAL_TABLET | Freq: Every day | ORAL | 0 refills | Status: DC

## 2016-04-03 MED FILL — PHENTERMINE 37.5 MG TABLET: 37.5 | 30 days supply | Qty: 30 | Fill #0

## 2016-04-03 NOTE — Progress Notes (Signed)
Subjective:    Patient ID: Rachael [name], female    DOB: [birth year], 30 y.o.   MRN: [id number]  HPI Patient is a 30 year old new patient female who presents to the clinic to establish care.  .. Active Ambulatory Problems    Diagnosis Date Noted  . Allergy-induced asthma 06/30/2009  . Allergic rhinitis 10/11/2011  . Eustachian tube dysfunction 06/30/2014  . Pain in left ear 06/30/2014  . Family history of breast cancer   . Genetic testing 02/22/2015  . Family hx of ovarian malignancy 04/03/2016  . Overweight (BMI 25.0-29.9) 04/03/2016   Resolved Ambulatory Problems    Diagnosis Date Noted  . Normal first pregnancy confirmed, currently in third trimester 06/25/2014  . SVD (spontaneous vaginal delivery) 06/26/2014   Past Medical History:  Diagnosis Date  . Allergy   . Asthma   . Family history of breast cancer   . SVD (spontaneous vaginal delivery) 06/26/2014   .[name] Family History  Problem Relation Age of Onset  . Breast cancer Maternal Grandmother     Dx 14s; deceased 39s  . Diabetes Maternal Grandmother   . Cancer Maternal Grandmother   . Breast cancer Paternal Grandmother     Dx 75s; deceased 27s  . Diabetes Paternal Grandmother   . Cancer Paternal Grandmother   . Cancer Mother   . Diabetes      both parents  . Diabetes Maternal Grandfather   . Breast cancer Other     pat grandmother's mother  . Diabetes Paternal Grandfather    .[name] Social History   Social History  . Marital status: Married    Spouse name: Rachael [name]  . Number of children: 0  . Years of education: N/A   Occupational History  . [employer]   Social History Main Topics  . Smoking status: Never Smoker  . Smokeless tobacco: Never Used  . Alcohol use Yes     Comment: social use  . Drug use: No  . Sexual activity: Yes   Other Topics Concern  . Not on file   Social History Narrative   Lives with husband   1 dog   Expecting first child   Works at [employer]  ([place])   Working on Museum/gallery exhibitions officer at [employer]   Patient does have a past history of allergy-induced asthma. She has a Xopenex inhaler at home to use as needed.  She does see gr per OB/GYN for her Pap smears. She does report genetic counseling due to her mother's ovarian cancer. She had no increased risk of ovarian cancer.  She has a 75 month old son. She is trying to completely lose all her baby weight. She was previously on phentermine at a nutritionist office. It became too pricey. She wonders if she can get a refill today to help with weight loss. She is exercising every day one hour on average walking and running.   Review of Systems  All other systems reviewed and are negative.      Objective:   Physical Exam  Constitutional: She is oriented to person, place, and time. She appears well-developed and well-nourished.  HENT:  Head: Normocephalic and atraumatic.  Neck: Normal range of motion. Neck supple.  Cardiovascular: Normal rate, regular rhythm and normal heart sounds.   Pulmonary/Chest: Effort normal and breath sounds normal.  Lymphadenopathy:    She has no cervical adenopathy.  Neurological: She is alert and oriented to person, place, and time.  Psychiatric: She has a normal mood and  affect. Her behavior is normal.          Assessment & Plan:  Overweight-restarted phentermine. Discussed side effects. Discussed continuing with diet and exercise regimen. Follow-up in 4 weeks for nurse visit to recheck weight and vitals. Patient is aware that she cannot stay on this medicine but rather when she loses her 10-15 pounds she will have to come off this.  Allergy-induced asthma-controlled with as needed Xopenex inhaler at home. Zyrtec daily.   Screening labs ordered today.

## 2016-04-04 LAB — LIPID PANEL
Cholesterol: 120 mg/dL — ABNORMAL LOW (ref 125–200)
HDL: 70 mg/dL (ref >=46)
LDL CALC: 22 mg/dL (ref <130)
Total CHOL/HDL Ratio: 1.7 Ratio (ref <=5.0)
Triglycerides: 138 mg/dL (ref <150)
VLDL: 28 mg/dL (ref <30)

## 2016-04-04 LAB — COMPLETE METABOLIC PANEL WITH GFR
ALT: 35 U/L — ABNORMAL HIGH (ref 6–29)
AST: 22 U/L (ref 10–30)
Albumin: 3.9 g/dL (ref 3.6–5.1)
Alkaline Phosphatase: 31 U/L — ABNORMAL LOW (ref 33–115)
BILIRUBIN TOTAL: 0.8 mg/dL (ref 0.2–1.2)
BUN: 10 mg/dL (ref 7–25)
CHLORIDE: 107 mmol/L (ref 98–110)
CO2: 21 mmol/L (ref 20–31)
Calcium: 8.9 mg/dL (ref 8.6–10.2)
Creat: 0.78 mg/dL (ref 0.50–1.10)
Glucose, Bld: 77 mg/dL (ref 65–99)
Potassium: 4.5 mmol/L (ref 3.5–5.3)
Sodium: 138 mmol/L (ref 135–146)
Total Protein: 7.2 g/dL (ref 6.1–8.1)

## 2016-04-12 MED FILL — ELINEST-28 TABLET: 0.3-30 | 70 days supply | Qty: 84 | Fill #0

## 2016-05-01 ENCOUNTER — Ambulatory Visit (INDEPENDENT_AMBULATORY_CARE_PROVIDER_SITE_OTHER): Payer: 59 | Admitting: Physician Assistant

## 2016-05-01 VITALS — BP 112/78 | HR 106 | Wt 155.0 lb

## 2016-05-01 DIAGNOSIS — R635 Abnormal weight gain: Secondary | ICD-10-CM | POA: Diagnosis not present

## 2016-05-01 MED ORDER — PHENTERMINE HCL 37.5 MG PO TABS: 37.5000 mg | ORAL_TABLET | Freq: Every day | ORAL | 0 refills | Status: DC

## 2016-05-01 MED FILL — PHENTERMINE 37.5 MG TABLET: 37.5 | 30 days supply | Qty: 30 | Fill #0

## 2016-05-01 NOTE — Progress Notes (Signed)
Patient is here for blood pressure and weight check. Denies any trouble sleeping, palpitations, or any other medication problems. Patient has lost weight. A refill for Phentermine will be sent to patient preferred pharmacy. Patient advised to schedule a four week nurse visit and keep her upcoming appointment with her PCP. Verbalized understanding, no further questions.

## 2016-05-18 ENCOUNTER — Ambulatory Visit: Payer: 59 | Admitting: Sports Medicine

## 2016-05-29 ENCOUNTER — Ambulatory Visit: Payer: 59

## 2016-05-31 ENCOUNTER — Encounter: Payer: Self-pay | Admitting: Physician Assistant

## 2016-06-04 ENCOUNTER — Ambulatory Visit (INDEPENDENT_AMBULATORY_CARE_PROVIDER_SITE_OTHER): Payer: 59 | Admitting: Physician Assistant

## 2016-06-04 VITALS — BP 121/88 | HR 88 | Wt 153.0 lb

## 2016-06-04 DIAGNOSIS — R635 Abnormal weight gain: Secondary | ICD-10-CM | POA: Diagnosis not present

## 2016-06-04 MED ORDER — PHENTERMINE HCL 37.5 MG PO TABS: 37.5000 mg | ORAL_TABLET | Freq: Every day | ORAL | 0 refills | Status: DC

## 2016-06-04 MED FILL — PHENTERMINE 37.5 MG TABLET: 37.5 | 30 days supply | Qty: 30 | Fill #0

## 2016-06-04 NOTE — Progress Notes (Signed)
Patient is here for blood pressure and weight check. Denies any trouble sleeping, palpitations, or any other medication problems. Patient has lost some weight, advised she should strive for a minimum 4 pound weight loss per month. A refill for Phentermine will be sent to patient preferred pharmacy. Patient advised to schedule a four week nurse visit and keep her upcoming appointment with her PCP. Verbalized understanding, no further questions.

## 2016-06-25 MED FILL — ELINEST-28 TABLET: 0.3-30 | 70 days supply | Qty: 84 | Fill #1

## 2016-07-06 ENCOUNTER — Ambulatory Visit (INDEPENDENT_AMBULATORY_CARE_PROVIDER_SITE_OTHER): Payer: 59 | Admitting: Physician Assistant

## 2016-07-06 VITALS — BP 128/80 | HR 102 | Wt 154.0 lb

## 2016-07-06 DIAGNOSIS — R635 Abnormal weight gain: Secondary | ICD-10-CM

## 2016-07-06 MED ORDER — PHENTERMINE HCL 37.5 MG PO TABS: 37.5000 mg | ORAL_TABLET | Freq: Every day | ORAL | 0 refills | Status: DC

## 2016-07-06 MED FILL — PHENTERMINE 37.5 MG TABLET: 37.5 | 30 days supply | Qty: 30 | Fill #0

## 2016-07-06 NOTE — Progress Notes (Signed)
Patient is here for blood pressure and weight check. Denies any trouble sleeping, palpitations, or any other medication problems. Patient has not lost weight. Advised Pt her goal should be a 4 lb weight loss per month, Pt states she feels like she has hit a plateau. A refill for Phentermine will be sent to Provider for review. Patient advised to schedule a four week appointment with her PCP. Advised I would contact her with any recommendations from PCP. Verbalized understanding, no further questions.  We refill for one more month to taper off. 1/2 tablet for next few weeks. Follow up if not able to maintain healthy weight.   Left VM of PCP recommendation. Callback provided for questions.

## 2016-07-18 MED FILL — ELINEST-28 TABLET: 0.3-30 | 21 days supply | Qty: 28 | Fill #2

## 2016-07-31 ENCOUNTER — Encounter: Payer: Self-pay | Admitting: Physician Assistant

## 2016-08-03 ENCOUNTER — Ambulatory Visit: Payer: 59 | Admitting: Physician Assistant

## 2016-08-14 MED FILL — ELINEST-28 TABLET: 0.3-30 | 70 days supply | Qty: 84 | Fill #3

## 2016-09-24 ENCOUNTER — Encounter: Payer: Self-pay | Admitting: Physician Assistant

## 2016-10-16 MED FILL — ELINEST-28 TABLET: 0.3-30 | 70 days supply | Qty: 84 | Fill #4

## 2016-12-19 ENCOUNTER — Ambulatory Visit (INDEPENDENT_AMBULATORY_CARE_PROVIDER_SITE_OTHER): Payer: 59 | Admitting: Osteopathic Medicine

## 2016-12-19 VITALS — BP 110/85 | Ht 66.0 in | Wt 165.0 lb

## 2016-12-19 DIAGNOSIS — K529 Noninfective gastroenteritis and colitis, unspecified: Secondary | ICD-10-CM | POA: Diagnosis not present

## 2016-12-19 MED ORDER — CIPROFLOXACIN HCL 500 MG PO TABS: 500.0000 mg | ORAL_TABLET | Freq: Two times a day (BID) | ORAL | 0 refills | Status: DC

## 2016-12-19 MED FILL — CIPROFLOXACIN HCL 500 MG TA: 500 | 7 days supply | Qty: 14 | Fill #0

## 2016-12-19 NOTE — Patient Instructions (Signed)
Plan: 1. Given possible salmonella exposure, will get stool culture to confirm whether salmonella is the case (collect specimen before starting antibiotics), and will treat with antibiotics 2. If symptoms are worse on antibiotics, call us and stop the antibiotics 3. If this is a viral infection, it should get better within 5-7 days either way 4. Stay hydrated! Bland diet. Diligent hand washing to avoid spreading this to close contacts

## 2016-12-19 NOTE — Progress Notes (Signed)
HPI: Rachael [name] is a 31 y.o. female  who presents to [facility] today, 12/19/16,  for chief complaint of:  Chief Complaint  Patient presents with  . Diarrhea    Recent exposure to recalled eggs (salmonella concern). Having frequent loose stool, watery/nonbloody, going to the bathroom every hour or so. Associated w/ some lower abdominal cramping. No fever. No vomiting. No cough/URI symptoms.    Past medical history, surgical history, social history and family history reviewed.  Patient Active Problem List   Diagnosis Date Noted  . Family hx of ovarian malignancy 04/03/2016  . Overweight (BMI 25.0-29.9) 04/03/2016  . Genetic testing 02/22/2015  . Family history of breast cancer   . Eustachian tube dysfunction 06/30/2014  . Pain in left ear 06/30/2014  . Allergic rhinitis 10/11/2011  . Allergy-induced asthma 06/30/2009    Current medication list and allergy/intolerance information reviewed.   Current Outpatient Prescriptions on File Prior to Visit  Medication Sig Dispense Refill  . cetirizine (ZYRTEC) 10 MG tablet Take 10 mg by mouth daily.    . norgestrel-ethinyl estradiol (LO/OVRAL,CRYSELLE) 0.3-30 MG-MCG tablet Take 1 tablet by mouth daily.    . phentermine (ADIPEX-P) 37.5 MG tablet Take 1 tablet (37.5 mg total) by mouth daily before breakfast. 30 tablet 0   No current facility-administered medications on file prior to visit.    No Known Allergies    Review of Systems:  Constitutional: No fever/chills, +fatigue  HEENT: No  headache, no vision change  Cardiac: No  chest pain, No  pressure, No palpitations  Respiratory:  No  shortness of breath. No  Cough  Gastrointestinal: +abdominal pain, +change on bowel habits  Musculoskeletal: No new myalgia/arthralgia  Skin: No  Rash  Exam:  Ht 5\' 6"  (1.676 m)   Wt 165 lb (74.8 kg)   BMI 26.63 kg/m   Constitutional: VS see above. General Appearance: alert, well-developed,  well-nourished, NAD  Respiratory: Normal respiratory effort. no wheeze, no rhonchi, no rales  Cardiovascular: S1/S2 normal, no murmur, no rub/gallop auscultated. RRR.   Musculoskeletal: Gait normal. Symmetric and independent movement of all extremities  Abdominal: (+)mild ttp LLQ, no rebound/guarding, normal BS x4, rectal exam deferred, nondistended, normal percussion  Neurological: Normal balance/coordination. No tremor.  Skin: warm, dry, intact.   Psychiatric: Normal judgment/insight. Normal mood and affect. Oriented x3.     ASSESSMENT/PLAN:   Possible salmonella vs viral infection - see pt instructions.   Gastroenteritis - Plan: Stool Culture, ciprofloxacin (CIPRO) 500 MG tablet    Patient Instructions  Plan: 1. Given possible salmonella exposure, will get stool culture to confirm whether salmonella is the case (collect specimen before starting antibiotics), and will treat with antibiotics 2. If symptoms are worse on antibiotics, call us and stop the antibiotics 3. If this is a viral infection, it should get better within 5-7 days either way 4. Stay hydrated! Bland diet. Diligent hand washing to avoid spreading this to close contacts       Follow-up plan: Return if symptoms worsen or fail to improve.  Visit summary with medication list and pertinent instructions was printed for patient to review, alert [place] if any changes needed. All questions at time of visit were answered - patient instructed to contact office with any additional concerns. ER/RTC precautions were reviewed with the patient and understanding verbalized.

## 2016-12-23 LAB — STOOL CULTURE

## 2016-12-25 MED FILL — ELINEST-28 TABLET: 0.3-30 | 70 days supply | Qty: 84 | Fill #5

## 2017-01-18 MED FILL — BELVIQ 10 MG TABLET: 10 | 30 days supply | Qty: 60 | Fill #0

## 2017-02-11 MED FILL — BELVIQ 10 MG TABLET: 10 | 30 days supply | Qty: 60 | Fill #0

## 2017-03-01 DIAGNOSIS — H5213 Myopia, bilateral: Secondary | ICD-10-CM | POA: Diagnosis not present

## 2017-03-04 MED FILL — ELINEST-28 TABLET: 0.3-30 | 70 days supply | Qty: 84 | Fill #0

## 2017-03-04 MED FILL — PHENTERMINE 37.5 MG TABLET: 37.5 | 30 days supply | Qty: 30 | Fill #0

## 2017-03-31 ENCOUNTER — Encounter: Payer: Self-pay | Admitting: Emergency Medicine

## 2017-03-31 ENCOUNTER — Emergency Department (INDEPENDENT_AMBULATORY_CARE_PROVIDER_SITE_OTHER)
Admission: EM | Admit: 2017-03-31 | Discharge: 2017-03-31 | Disposition: A | Discharge status: Home / Self Care | Payer: 59 | Source: Home / Self Care | Attending: Family Medicine | Admitting: Family Medicine

## 2017-03-31 DIAGNOSIS — H938X3 Other specified disorders of ear, bilateral: Secondary | ICD-10-CM

## 2017-03-31 DIAGNOSIS — J069 Acute upper respiratory infection, unspecified: Secondary | ICD-10-CM | POA: Diagnosis not present

## 2017-03-31 MED ORDER — PREDNISONE 20 MG PO TABS: ORAL_TABLET | ORAL | 0 refills | Status: DC

## 2017-03-31 NOTE — ED Provider Notes (Signed)
CSN: [id number]     Arrival date & time 03/31/17  1114 History   First MD Initiated Contact with Patient 03/31/17 1136     Chief Complaint  Patient presents with  . Otalgia   (Consider location/radiation/quality/duration/timing/severity/associated sxs/prior Treatment) HPI  Rachael [name] is a 31 y.o. female presenting to UC with c/o bilateral ear pain and sore throat for 4 days. She had a fever of 102*F the first day but has not had one since then.  Ear pain is aching and throbbing, 7/10. Denies cough or congestion. She did go swimming in the ocean and in a pool a few weeks ago but only started to have current symptoms 4 days ago.  Her son and husband have also been sick recently.  She has tried Flonase, sinus rinses, and tylenol without much relief.  Denies n/v/d.    Past Medical History:  Diagnosis Date  . Allergy   . Asthma   . Family history of breast cancer   . SVD (spontaneous vaginal delivery) 06/26/2014   Past Surgical History:  Procedure Laterality Date  . WISDOM TOOTH EXTRACTION     Family History  Problem Relation Age of Onset  . Breast cancer Maternal Grandmother        Dx 8s; deceased 22s  . Diabetes Maternal Grandmother   . Cancer Maternal Grandmother   . Breast cancer Paternal Grandmother        Dx 5s; deceased 48s  . Diabetes Paternal Grandmother   . Cancer Paternal Grandmother   . Cancer Mother   . Diabetes Unknown        both parents  . Diabetes Maternal Grandfather   . Breast cancer Other        pat grandmother's mother  . Diabetes Paternal Grandfather    Social History  Substance Use Topics  . Smoking status: Never Smoker  . Smokeless tobacco: Never Used  . Alcohol use Yes     Comment: social use   OB History    Gravida Para Term Preterm AB Living   1 1 1     1    SAB TAB Ectopic Multiple Live Births           1     Review of Systems  Constitutional: Positive for fever. Negative for chills.  HENT: Positive for ear pain and sore throat.  Negative for congestion, trouble swallowing and voice change.   Respiratory: Negative for cough and shortness of breath.   Cardiovascular: Negative for chest pain and palpitations.  Gastrointestinal: Negative for abdominal pain, diarrhea, nausea and vomiting.  Musculoskeletal: Negative for arthralgias, back pain and myalgias.  Skin: Negative for rash.  Neurological: Positive for headaches. Negative for dizziness and light-headedness.    Allergies  Patient has no known allergies.  Home Medications   Prior to Admission medications   Medication Sig Start Date End Date Taking? Authorizing Provider  cetirizine (ZYRTEC) 10 MG tablet Take 10 mg by mouth daily.   Yes [provider]  predniSONE (DELTASONE) 20 MG tablet 3 tabs po day one, then 2 po daily x 4 days 03/31/17   Noe Gens, PA-C   Meds Ordered and Administered this Visit  Medications - No data to display  BP 111/79 (BP Location: Left Arm)   Pulse (!) 108   Temp 98.6 F (37 C) (Oral)   Resp 18   Ht 5\' 6"  (1.676 m)   Wt 161 lb 8 oz (73.3 kg)   LMP 03/29/2017 (Exact Date)  SpO2 97%   BMI 26.07 kg/m  No data found.   Physical Exam  Constitutional: She is oriented to person, place, and time. She appears well-developed and well-nourished. No distress.  HENT:  Head: Normocephalic and atraumatic.  Right Ear: Tympanic membrane and ear canal normal. Tympanic membrane is not erythematous and not bulging.  Left Ear: Ear canal normal. Tympanic membrane is not erythematous and not bulging. A middle ear effusion is present.  Nose: Nose normal.  Mouth/Throat: Uvula is midline and mucous membranes are normal. Posterior oropharyngeal erythema present. No oropharyngeal exudate, posterior oropharyngeal edema or tonsillar abscesses.  Eyes: EOM are normal.  Neck: Normal range of motion. Neck supple.  Cardiovascular: Normal rate and regular rhythm.   Mild tachycardia in triage. Normal rate and regular rhythm on exam.   Pulmonary/Chest: Effort normal and breath sounds normal. No stridor. No respiratory distress. She has no wheezes. She has no rales.  Musculoskeletal: Normal range of motion.  Lymphadenopathy:    She has no cervical adenopathy.  Neurological: She is alert and oriented to person, place, and time.  Skin: Skin is warm and dry. She is not diaphoretic.  Psychiatric: She has a normal mood and affect. Her behavior is normal.  Nursing note and vitals reviewed.   Urgent Care Course     Procedures (including critical care time)  Labs Review Labs Reviewed - No data to display  Imaging Review No results found.   MDM   1. Viral URI   2. Ear pressure, bilateral    Pt c/o ear pain, fever on first day but none since.  Tympanometry: Normal in Left and Right ears Throat: erythematous, no edema or exudate   Symptoms likely viral Rx: prednisone Encouraged to continue symptomatic treatment F/u with PCP in 7-10 days if not improving, sooner if significantly worsening.    Noe Gens, [place] 03/31/17 1212

## 2017-03-31 NOTE — ED Triage Notes (Signed)
Bi-Lateral ear pain and sore throat x 4 days. Occasional fever off and on.

## 2017-05-08 MED FILL — ELINEST-28 TABLET: 0.3-30 | 28 days supply | Qty: 28 | Fill #0

## 2017-05-22 ENCOUNTER — Ambulatory Visit (INDEPENDENT_AMBULATORY_CARE_PROVIDER_SITE_OTHER): Payer: 59 | Admitting: Obstetrics & Gynecology

## 2017-05-22 ENCOUNTER — Encounter: Payer: Self-pay | Admitting: Obstetrics & Gynecology

## 2017-05-22 VITALS — BP 124/88 | HR 88 | Ht 66.0 in | Wt 169.0 lb

## 2017-05-22 DIAGNOSIS — Z1151 Encounter for screening for human papillomavirus (HPV): Secondary | ICD-10-CM

## 2017-05-22 DIAGNOSIS — Z01419 Encounter for gynecological examination (general) (routine) without abnormal findings: Secondary | ICD-10-CM | POA: Diagnosis not present

## 2017-05-22 DIAGNOSIS — Z124 Encounter for screening for malignant neoplasm of cervix: Secondary | ICD-10-CM | POA: Diagnosis not present

## 2017-05-22 MED ORDER — ELINEST 0.3-30 MG-MCG PO TABS: 1.0000 | ORAL_TABLET | Freq: Every day | ORAL | 14 refills | Status: DC

## 2017-05-22 NOTE — Progress Notes (Signed)
Subjective:     Rachael [name] is a 31 y.o. female here for a routine exam.  Current complaints: may want to try to conceive in a few months; happy with OCPs continuous with menses q 3 months, first episode of breakthrough bleeding happened today (in week before placebo pills).  Pt is negative for genetic breast/ovarian cancer screening (see [facility]backs notes).   Gynecologic History Patient's last menstrual period was 03/24/2017. Contraception: OCP (estrogen/progesterone) Last Pap: 2016ish. Results were: normal per patient Last mammogram: n/a  Obstetric History OB History  Gravida Para Term Preterm AB Living  1 1 1     1   SAB TAB Ectopic Multiple Live Births          1    # Outcome Date GA Lbr Len/2nd Weight Sex Delivery Anes PTL Lv  1 Term 06/26/14 [redacted]w[redacted]d 12:20 / 03:56 8 lb 2 oz (3.685 kg) M Vag-Spont EPI  LIV       The following portions of the patient's history were reviewed and updated as appropriate: allergies, current medications, past family history, past medical history, past social history, past surgical history and problem list.  Review of Systems Pertinent items noted in HPI and remainder of comprehensive ROS otherwise negative.    Objective:      Vitals:   05/22/17 0948  BP: 124/88  Pulse: 88  Weight: 169 lb (76.7 kg)  Height: 5\' 6"  (1.676 m)   Vitals:  WNL General appearance: alert, cooperative and no distress  HEENT: Normocephalic, without obvious abnormality, atraumatic Eyes: negative Throat: lips, mucosa, and tongue normal; teeth and gums normal  Respiratory: Clear to auscultation bilaterally  CV: Regular rate and rhythm  Breasts:  Normal appearance, no masses or tenderness, no nipple retraction or dimpling  GI: Soft, non-tender; bowel sounds normal; no masses,  no organomegaly  GU: External Genitalia:  Tanner V, no lesion Urethra:  No prolapse   Vagina: Pink, normal rugae, small amount of blood at cervix  Cervix: No CMT, no lesion  Uterus:   Normal size and contour, non tender  Adnexa: Normal, no masses, non tender  Musculoskeletal: No edema, redness or tenderness in the calves or thighs  Skin: No lesions or rash  Lymphatic: Axillary adenopathy: none     Psychiatric: Normal mood and behavior        Assessment:    Healthy female exam.    Plan:    Pap with co testing Self Breast Exams Preconceptual counseling--esp PN one month before conceiving Refills of OCPs

## 2017-05-22 NOTE — Progress Notes (Signed)
Last pap 12/18/2014, normal result

## 2017-05-24 LAB — CYTOLOGY - PAP
Diagnosis: NEGATIVE
HPV (WINDOPATH): NOT DETECTED

## 2017-06-27 ENCOUNTER — Encounter: Payer: Self-pay | Admitting: Physician Assistant

## 2017-06-27 ENCOUNTER — Ambulatory Visit (INDEPENDENT_AMBULATORY_CARE_PROVIDER_SITE_OTHER): Payer: 59 | Admitting: Physician Assistant

## 2017-06-27 VITALS — BP 112/76 | HR 87 | Temp 98.4°F | Wt 170.0 lb

## 2017-06-27 DIAGNOSIS — J00 Acute nasopharyngitis [common cold]: Secondary | ICD-10-CM | POA: Diagnosis not present

## 2017-06-27 LAB — POCT RAPID STREP A (OFFICE): Rapid Strep A Screen: NEGATIVE

## 2017-06-27 MED ORDER — BENZONATATE 200 MG PO CAPS: 200.0000 mg | ORAL_CAPSULE | Freq: Three times a day (TID) | ORAL | 0 refills | Status: DC | PRN

## 2017-06-27 MED ORDER — IPRATROPIUM BROMIDE 0.06 % NA SOLN: 1.0000 | Freq: Four times a day (QID) | NASAL | 0 refills | Status: DC | PRN

## 2017-06-27 NOTE — Progress Notes (Signed)
HPI:                                                                Rachael [name] is a 31 y.o. female who presents to [facility]: [facility] today for sore throat  Sore Throat   This is a new problem. The current episode started in the past 7 days. The problem has been gradually worsening. Neither side of throat is experiencing more pain than the other. There has been no fever. The pain is moderate. Associated symptoms include congestion, coughing and ear pain. Pertinent negatives include no shortness of breath or trouble swallowing. She has had no exposure to strep or mono. Treatments tried: zyrtec, flonase. The treatment provided no relief.     Past Medical History:  Diagnosis Date  . Allergy   . Asthma   . Family history of breast cancer   . SVD (spontaneous vaginal delivery) 06/26/2014   Past Surgical History:  Procedure Laterality Date  . WISDOM TOOTH EXTRACTION     Social History  Substance Use Topics  . Smoking status: Never Smoker  . Smokeless tobacco: Never Used  . Alcohol use Yes     Comment: social use   family history includes Breast cancer in her maternal grandmother, other, and paternal grandmother; Cancer in her maternal grandmother and paternal grandmother; Diabetes in her maternal grandfather, maternal grandmother, paternal grandfather, paternal grandmother, and unknown relative; Ovarian cancer in her mother.  ROS: negative except as noted in the HPI  Medications: Current Outpatient Prescriptions  Medication Sig Dispense Refill  . benzonatate (TESSALON) 200 MG capsule Take 1 capsule (200 mg total) by mouth 3 (three) times daily as needed for cough. 30 capsule 0  . cetirizine (ZYRTEC) 10 MG tablet Take 10 mg by mouth daily.    [name] ELINEST 0.3-30 MG-MCG tablet Take 1 tablet by mouth daily. 1 Package 14  . ipratropium (ATROVENT) 0.06 % nasal spray Place 1 spray into both nostrils 4 (four) times daily as needed. 15 mL 0   No  current facility-administered medications for this visit.    No Known Allergies     Objective:  BP 112/76   Pulse 87   Temp 98.4 F (36.9 C) (Oral)   Wt 170 lb (77.1 kg)   BMI 27.44 kg/m  Gen:  alert, not ill-appearing, no distress, appropriate for age 82: head normocephalic without obvious abnormality, conjunctiva and cornea clear, left TM clear, right TM with serous effusion, nonbulging, no erythema, nasal mucosa edematous, no frontal or maxillary sinus tenderness, oropharynx with erythema, no exudates, uvula midline, neck supple, no tonsillar or cervical adenopaty, trachea midline Pulm: Normal work of breathing, normal phonation, clear to auscultation bilaterally, no wheezes, rales or rhonchi CV: Normal rate, regular rhythm, s1 and s2 distinct, no murmurs, clicks or rubs  Neuro: alert and oriented x 3, no tremor MSK: extremities atraumatic, normal gait and station Skin: intact, no rashes on exposed skin, no jaundice, no cyanosis Psych: well-groomed, cooperative, good eye contact, euthymic mood, affect mood-congruent, speech is articulate, and thought processes clear and goal-directed  Depression screen [facility] 2/9 06/27/2017  Decreased Interest 0  Down, Depressed, Hopeless 0  PHQ - 2 Score 0     No results found for this  or any previous visit (from the past 72 hour(s)). No results found.    Assessment and Plan: 31 y.o. female with   1. Nasopharyngitis - Centor score 1, negative rapid Strep - symptomatic management with nasal spray, oral decongestant and alternating Acetaminophen and Tylenol - push PO fluids - benzonatate (TESSALON) 200 MG capsule; Take 1 capsule (200 mg total) by mouth 3 (three) times daily as needed for cough.  Dispense: 30 capsule; Refill: 0 - ipratropium (ATROVENT) 0.06 % nasal spray; Place 1 spray into both nostrils 4 (four) times daily as needed.  Dispense: 15 mL; Refill: 0  Patient education and anticipatory guidance given Patient agrees with  treatment plan Follow-up as needed if symptoms worsen or fail to improve  Darlyne Russian PA-C

## 2017-06-27 NOTE — Patient Instructions (Addendum)
- Stop claritin and flonase - Start Atrovent nasal spray up to four times daily - Pseudephedrine (Sudafed) for nasal congestion - Tylenol 1000mg  every 8 hours as needed for throat pain. Alternate with Ibuprofen 600mg  every 6 hours - Cepacol throat lozenges - Warm salt water gargles - Lots of fluids   Upper Respiratory Infection, Adult Most upper respiratory infections (URIs) are a viral infection of the air passages leading to the lungs. A URI affects the nose, throat, and upper air passages. The most common type of URI is nasopharyngitis and is typically referred to as "the common cold." URIs run their course and usually go away on their own. Most of the time, a URI does not require medical attention, but sometimes a bacterial infection in the upper airways can follow a viral infection. This is called a secondary infection. Sinus and middle ear infections are common types of secondary upper respiratory infections. Bacterial pneumonia can also complicate a URI. A URI can worsen asthma and chronic obstructive pulmonary disease (COPD). Sometimes, these complications can require emergency medical care and may be life threatening. What are the causes? Almost all URIs are caused by viruses. A virus is a type of germ and can spread from one person to another. What increases the risk? You may be at risk for a URI if:  You smoke.  You have chronic heart or lung disease.  You have a weakened defense (immune) system.  You are very young or very old.  You have nasal allergies or asthma.  You work in crowded or poorly ventilated areas.  You work in health care facilities or schools.  What are the signs or symptoms? Symptoms typically develop 2-3 days after you come in contact with a cold virus. Most viral URIs last 7-10 days. However, viral URIs from the influenza virus (flu virus) can last 14-18 days and are typically more severe. Symptoms may include:  Runny or stuffy (congested)  nose.  Sneezing.  Cough.  Sore throat.  Headache.  Fatigue.  Fever.  Loss of appetite.  Pain in your forehead, behind your eyes, and over your cheekbones (sinus pain).  Muscle aches.  How is this diagnosed? Your health care provider may diagnose a URI by:  Physical exam.  Tests to check that your symptoms are not due to another condition such as: ? Strep throat. ? Sinusitis. ? Pneumonia. ? Asthma.  How is this treated? A URI goes away on its own with time. It cannot be cured with medicines, but medicines may be prescribed or recommended to relieve symptoms. Medicines may help:  Reduce your fever.  Reduce your cough.  Relieve nasal congestion.  Follow these instructions at home:  Take medicines only as directed by your health care provider.  Gargle warm saltwater or take cough drops to comfort your throat as directed by your health care provider.  Use a warm mist humidifier or inhale steam from a shower to increase air moisture. This may make it easier to breathe.  Drink enough fluid to keep your urine clear or pale yellow.  Eat soups and other clear broths and maintain good nutrition.  Rest as needed.  Return to work when your temperature has returned to normal or as your health care provider advises. You may need to stay home longer to avoid infecting others. You can also use a face mask and careful hand washing to prevent spread of the virus.  Increase the usage of your inhaler if you have asthma.  Do not  use any tobacco products, including cigarettes, chewing tobacco, or electronic cigarettes. If you need help quitting, ask your health care provider. How is this prevented? The best way to protect yourself from getting a cold is to practice good hygiene.  Avoid oral or hand contact with people with cold symptoms.  Wash your hands often if contact occurs.  There is no clear evidence that vitamin C, vitamin E, echinacea, or exercise reduces the chance  of developing a cold. However, it is always recommended to get plenty of rest, exercise, and practice good nutrition. Contact a health care provider if:  You are getting worse rather than better.  Your symptoms are not controlled by medicine.  You have chills.  You have worsening shortness of breath.  You have brown or red mucus.  You have yellow or brown nasal discharge.  You have pain in your face, especially when you bend forward.  You have a fever.  You have swollen neck glands.  You have pain while swallowing.  You have white areas in the back of your throat. Get help right away if:  You have severe or persistent: ? Headache. ? Ear pain. ? Sinus pain. ? Chest pain.  You have chronic lung disease and any of the following: ? Wheezing. ? Prolonged cough. ? Coughing up blood. ? A change in your usual mucus.  You have a stiff neck.  You have changes in your: ? Vision. ? Hearing. ? Thinking. ? Mood. This information is not intended to replace advice given to you by your health care provider. Make sure you discuss any questions you have with your health care provider. Document Released: 02/13/2001 Document Revised: 04/22/2016 Document Reviewed: 11/25/2013 Elsevier Interactive Patient Education  2017 [employer].

## 2017-06-28 ENCOUNTER — Other Ambulatory Visit: Payer: Self-pay

## 2017-06-28 ENCOUNTER — Encounter: Payer: Self-pay | Admitting: Physician Assistant

## 2017-06-28 DIAGNOSIS — J00 Acute nasopharyngitis [common cold]: Secondary | ICD-10-CM

## 2017-06-28 MED ORDER — BENZONATATE 200 MG PO CAPS: 200.0000 mg | ORAL_CAPSULE | Freq: Three times a day (TID) | ORAL | 0 refills | Status: DC | PRN

## 2017-06-28 MED ORDER — IPRATROPIUM BROMIDE 0.06 % NA SOLN: 1.0000 | Freq: Four times a day (QID) | NASAL | 0 refills | Status: DC | PRN

## 2017-09-03 NOTE — L&D Delivery Note (Addendum)
Delivery Note At 9:47 AM a viable female was delivered via Vaginal, Spontaneous (Presentation: vertex; LOA ).  APGAR: 9, 9; weight 7 lb 9.5 oz (3445 g).   Placenta status: spontaneous, intact.  Cord: 3VC   Anesthesia:  Epidural Episiotomy: None Lacerations: 2nd degree;Periurethral, three hemostatic periurethral tears Suture Repair: 2.0 Est. Blood Loss (mL): 150  Mom to postpartum.  Baby to Couplet care / Skin to Skin.  Rachael [name] 04/26/2018, 11:01 AM   Upon arrival patient was complete and pushing. She pushed with good maternal effort to deliver a healthy baby boy. Baby delivered without difficulty, was noted to have good tone and place on maternal abdomen for oral suctioning, drying and stimulation. Delayed cord clamping performed. Placenta delivered intact with 3V cord. Vaginal canal and perineum was inspected and found to have three hemostatic periurethral tears in addition to a 2nd degree perineal laceration which was repaired without complication. Pitocin was started and uterus massaged until bleeding slowed. Counts of sharps, instruments, and lap pads were all correct.   Rachael Haymaker, MD PGY-1 8/24/201911:01 AM  Midwife attestation: I was gloved and present for delivery in its entirety and I agree with the above resident's note.  Julianne Handler, CNM 3:24 PM

## 2017-09-18 ENCOUNTER — Encounter: Payer: Self-pay | Admitting: *Deleted

## 2017-09-18 DIAGNOSIS — Z3480 Encounter for supervision of other normal pregnancy, unspecified trimester: Secondary | ICD-10-CM | POA: Insufficient documentation

## 2017-09-18 DIAGNOSIS — Z113 Encounter for screening for infections with a predominantly sexual mode of transmission: Secondary | ICD-10-CM | POA: Diagnosis not present

## 2017-09-19 LAB — OBSTETRIC PANEL
Antibody Screen: NOT DETECTED
BASOS ABS: 44 {cells}/uL (ref 0–200)
Basophils Relative: 0.4 %
Eosinophils Absolute: 196 cells/uL (ref 15–500)
Eosinophils Relative: 1.8 %
HEMATOCRIT: 35.7 % (ref 35.0–45.0)
Hemoglobin: 12.4 g/dL (ref 11.7–15.5)
Hepatitis B Surface Ag: NONREACTIVE
Lymphs Abs: 2245 cells/uL (ref 850–3900)
MCH: 29.4 pg (ref 27.0–33.0)
MCHC: 34.7 g/dL (ref 32.0–36.0)
MCV: 84.6 fL (ref 80.0–100.0)
MPV: 10.2 fL (ref 7.5–12.5)
Monocytes Relative: 5.5 %
NEUTROS PCT: 71.7 %
Neutro Abs: 7815 cells/uL — ABNORMAL HIGH (ref 1500–7800)
Platelets: 259 10*3/uL (ref 140–400)
RBC: 4.22 10*6/uL (ref 3.80–5.10)
RDW: 12.5 % (ref 11.0–15.0)
RPR Ser Ql: NONREACTIVE
RUBELLA: 1.03 {index}
Total Lymphocyte: 20.6 %
WBC mixed population: 600 cells/uL (ref 200–950)
WBC: 10.9 10*3/uL — ABNORMAL HIGH (ref 3.8–10.8)

## 2017-09-19 LAB — HIV ANTIBODY (ROUTINE TESTING W REFLEX): HIV: NONREACTIVE

## 2017-09-19 LAB — HEMOGLOBIN A1C
Hgb A1c MFr Bld: 5 % of total Hgb (ref <5.7)
Mean Plasma Glucose: 97 (calc)
eAG (mmol/L): 5.4 (calc)

## 2017-09-20 ENCOUNTER — Encounter: Payer: Self-pay | Admitting: Advanced Practice Midwife

## 2017-09-20 ENCOUNTER — Ambulatory Visit (INDEPENDENT_AMBULATORY_CARE_PROVIDER_SITE_OTHER): Payer: No Typology Code available for payment source | Admitting: Advanced Practice Midwife

## 2017-09-20 DIAGNOSIS — Z3480 Encounter for supervision of other normal pregnancy, unspecified trimester: Secondary | ICD-10-CM

## 2017-09-20 DIAGNOSIS — Z3687 Encounter for antenatal screening for uncertain dates: Secondary | ICD-10-CM | POA: Diagnosis not present

## 2017-09-20 DIAGNOSIS — Z3481 Encounter for supervision of other normal pregnancy, first trimester: Secondary | ICD-10-CM

## 2017-09-20 LAB — URINE CYTOLOGY ANCILLARY ONLY
Chlamydia: NEGATIVE
NEISSERIA GONORRHEA: NEGATIVE

## 2017-09-20 NOTE — Progress Notes (Signed)
   PRENATAL VISIT NOTE  Subjective:  Rachael [name] is a 32 y.o. G2P1001 at [redacted]w[redacted]d being seen today for ongoing prenatal care.  She is currently monitored for the following issues for this low-risk pregnancy and has Allergy-induced asthma; Eustachian tube dysfunction; Family history of breast cancer; Genetic testing; Family hx of ovarian malignancy; Overweight (BMI 25.0-29.9); and Supervision of other normal pregnancy, antepartum on their problem list.  Patient reports fatigue.   . Vag. Bleeding: None.   . Denies leaking of fluid.   The following portions of the patient's history were reviewed and updated as appropriate: allergies, current medications, past family history, past medical history, past social history, past surgical history and problem list. Problem list updated.  Objective:   Vitals:   09/20/17 0810  BP: 116/73  Pulse: 87  Weight: 179 lb (81.2 kg)    Fetal Status: Fetal Heart Rate (bpm): 178         VS reviewed, nursing note reviewed,  Constitutional: well developed, well nourished, no distress HEENT: normocephalic CV: normal rate Pulm/chest wall: normal effort Breast Exam:  right breast normal with some fibrocystic breast changes, no skin or nipple changes or axillary nodes, left breast normal with some fibrocystic breast changes, no skin or nipple changes or axillary nodes Abdomen: soft Neuro: alert and oriented x 3 Skin: warm, dry Psych: affect normal Pelvic exam: Deferred  Assessment and Plan:  Pregnancy: G2P1001 at [redacted]w[redacted]d  1. Supervision of other normal pregnancy, antepartum --Pt with hx of SVD x 1, low risk pregnancy with some fatigue currently, no other concerns.  Planning to enroll in Healthy Pregnancy Program since she is [employer] employee and is participating in [place].   --Reviewed pt health and social history --Discussed our practice, teaching service with residents/students, what to expect at prenatal visits and visits to the hospital. --Discussed  genetic screening options, including declining testing. Pt desires NIPS if covered by insurance, will call her insurance to investigate. Pt to call office to schedule NIPS or first screen as needed. - Obstetric panel - HIV antibody (with reflex) - Culture, OB Urine - Urine cytology ancillary only - HgB A1c - Enroll Patient in Babyscripts - [place] MFM OB COMP + 14 WK; Future  Preterm labor symptoms and general obstetric precautions including but not limited to vaginal bleeding, contractions, leaking of fluid and fetal movement were reviewed in detail with the patient. Please refer to After Visit Summary for other counseling recommendations.  No Follow-up on file.   Fatima Blank, CNM

## 2017-09-20 NOTE — Progress Notes (Signed)
Bedside U/S shows single IUP with FHT of 178 BPM and CRL 23.27mm  GA 9w

## 2017-09-20 NOTE — Patient Instructions (Signed)
First Trimester of Pregnancy The first trimester of pregnancy is from week 1 until the end of week 13 (months 1 through 3). A week after a sperm fertilizes an egg, the egg will implant on the wall of the uterus. This embryo will begin to develop into a baby. Genes from you and your partner will form the baby. The female genes will determine whether the baby will be a boy or a girl. At 6-8 weeks, the eyes and face will be formed, and the heartbeat can be seen on ultrasound. At the end of 12 weeks, all the baby's organs will be formed. Now that you are pregnant, you will want to do everything you can to have a healthy baby. Two of the most important things are to get good prenatal care and to follow your health care provider's instructions. Prenatal care is all the medical care you receive before the baby's birth. This care will help prevent, find, and treat any problems during the pregnancy and childbirth. Body changes during your first trimester Your body goes through many changes during pregnancy. The changes vary from woman to woman.  You may gain or lose a couple of pounds at first.  You may feel sick to your stomach (nauseous) and you may throw up (vomit). If the vomiting is uncontrollable, call your health care provider.  You may tire easily.  You may develop headaches that can be relieved by medicines. All medicines should be approved by your health care provider.  You may urinate more often. Painful urination may mean you have a bladder infection.  You may develop heartburn as a result of your pregnancy.  You may develop constipation because certain hormones are causing the muscles that push stool through your intestines to slow down.  You may develop hemorrhoids or swollen veins (varicose veins).  Your breasts may begin to grow larger and become tender. Your nipples may stick out more, and the tissue that surrounds them (areola) may become darker.  Your gums may bleed and may be  sensitive to brushing and flossing.  Dark spots or blotches (chloasma, mask of pregnancy) may develop on your face. This will likely fade after the baby is born.  Your menstrual periods will stop.  You may have a loss of appetite.  You may develop cravings for certain kinds of food.  You may have changes in your emotions from day to day, such as being excited to be pregnant or being concerned that something may go wrong with the pregnancy and baby.  You may have more vivid and strange dreams.  You may have changes in your hair. These can include thickening of your hair, rapid growth, and changes in texture. Some women also have hair loss during or after pregnancy, or hair that feels dry or thin. Your hair will most likely return to normal after your baby is born.  What to expect at prenatal visits During a routine prenatal visit:  You will be weighed to make sure you and the baby are growing normally.  Your blood pressure will be taken.  Your abdomen will be measured to track your baby's growth.  The fetal heartbeat will be listened to between weeks 10 and 14 of your pregnancy.  Test results from any previous visits will be discussed.  Your health care provider may ask you:  How you are feeling.  If you are feeling the baby move.  If you have had any abnormal symptoms, such as leaking fluid, bleeding, severe headaches,   or abdominal cramping.  If you are using any tobacco products, including cigarettes, chewing tobacco, and electronic cigarettes.  If you have any questions.  Other tests that may be performed during your first trimester include:  Blood tests to find your blood type and to check for the presence of any previous infections. The tests will also be used to check for low iron levels (anemia) and protein on red blood cells (Rh antibodies). Depending on your risk factors, or if you previously had diabetes during pregnancy, you may have tests to check for high blood  sugar that affects pregnant women (gestational diabetes).  Urine tests to check for infections, diabetes, or protein in the urine.  An ultrasound to confirm the proper growth and development of the baby.  Fetal screens for spinal cord problems (spina bifida) and Down syndrome.  HIV (human immunodeficiency virus) testing. Routine prenatal testing includes screening for HIV, unless you choose not to have this test.  You may need other tests to make sure you and the baby are doing well.  Follow these instructions at home: Medicines  Follow your health care provider's instructions regarding medicine use. Specific medicines may be either safe or unsafe to take during pregnancy.  Take a prenatal vitamin that contains at least 600 micrograms (mcg) of folic acid.  If you develop constipation, try taking a stool softener if your health care provider approves. Eating and drinking  Eat a balanced diet that includes fresh fruits and vegetables, whole grains, good sources of protein such as meat, eggs, or tofu, and low-fat dairy. Your health care provider will help you determine the amount of weight gain that is right for you.  Avoid raw meat and uncooked cheese. These carry germs that can cause birth defects in the baby.  Eating four or five small meals rather than three large meals a day may help relieve nausea and vomiting. If you start to feel nauseous, eating a few soda crackers can be helpful. Drinking liquids between meals, instead of during meals, also seems to help ease nausea and vomiting.  Limit foods that are high in fat and processed sugars, such as fried and sweet foods.  To prevent constipation: ? Eat foods that are high in fiber, such as fresh fruits and vegetables, whole grains, and beans. ? Drink enough fluid to keep your urine clear or pale yellow. Activity  Exercise only as directed by your health care provider. Most women can continue their usual exercise routine during  pregnancy. Try to exercise for 30 minutes at least 5 days a week. Exercising will help you: ? Control your weight. ? Stay in shape. ? Be prepared for labor and delivery.  Experiencing pain or cramping in the lower abdomen or lower back is a good sign that you should stop exercising. Check with your health care provider before continuing with normal exercises.  Try to avoid standing for long periods of time. Move your legs often if you must stand in one place for a long time.  Avoid heavy lifting.  Wear low-heeled shoes and practice good posture.  You may continue to have sex unless your health care provider tells you not to. Relieving pain and discomfort  Wear a good support bra to relieve breast tenderness.  Take warm sitz baths to soothe any pain or discomfort caused by hemorrhoids. Use hemorrhoid cream if your health care provider approves.  Rest with your legs elevated if you have leg cramps or low back pain.  If you develop   varicose veins in your legs, wear support hose. Elevate your feet for 15 minutes, 3-4 times a day. Limit salt in your diet. Prenatal care  Schedule your prenatal visits by the twelfth week of pregnancy. They are usually scheduled monthly at first, then more often in the last 2 months before delivery.  Write down your questions. Take them to your prenatal visits.  Keep all your prenatal visits as told by your health care provider. This is important. Safety  Wear your seat belt at all times when driving.  Make a list of emergency phone numbers, including numbers for family, friends, the hospital, and police and fire departments. General instructions  Ask your health care provider for a referral to a local prenatal education class. Begin classes no later than the beginning of month 6 of your pregnancy.  Ask for help if you have counseling or nutritional needs during pregnancy. Your health care provider can offer advice or refer you to specialists for help  with various needs.  Do not use hot tubs, steam rooms, or saunas.  Do not douche or use tampons or scented sanitary pads.  Do not cross your legs for long periods of time.  Avoid cat litter boxes and soil used by cats. These carry germs that can cause birth defects in the baby and possibly loss of the fetus by miscarriage or stillbirth.  Avoid all smoking, herbs, alcohol, and medicines not prescribed by your health care provider. Chemicals in these products affect the formation and growth of the baby.  Do not use any products that contain nicotine or tobacco, such as cigarettes and e-cigarettes. If you need help quitting, ask your health care provider. You may receive counseling support and other resources to help you quit.  Schedule a dentist appointment. At home, brush your teeth with a soft toothbrush and be gentle when you floss. Contact a health care provider if:  You have dizziness.  You have mild pelvic cramps, pelvic pressure, or nagging pain in the abdominal area.  You have persistent nausea, vomiting, or diarrhea.  You have a bad smelling vaginal discharge.  You have pain when you urinate.  You notice increased swelling in your face, hands, legs, or ankles.  You are exposed to fifth disease or chickenpox.  You are exposed to German measles (rubella) and have never had it. Get help right away if:  You have a fever.  You are leaking fluid from your vagina.  You have spotting or bleeding from your vagina.  You have severe abdominal cramping or pain.  You have rapid weight gain or loss.  You vomit blood or material that looks like coffee grounds.  You develop a severe headache.  You have shortness of breath.  You have any kind of trauma, such as from a fall or a car accident. Summary  The first trimester of pregnancy is from week 1 until the end of week 13 (months 1 through 3).  Your body goes through many changes during pregnancy. The changes vary from  woman to woman.  You will have routine prenatal visits. During those visits, your health care provider will examine you, discuss any test results you may have, and talk with you about how you are feeling. This information is not intended to replace advice given to you by your health care provider. Make sure you discuss any questions you have with your health care provider. Document Released: 08/14/2001 Document Revised: 08/01/2016 Document Reviewed: 08/01/2016 Elsevier Interactive Patient Education  2018 [employer]  

## 2017-09-21 LAB — CULTURE, OB URINE

## 2017-09-21 LAB — URINE CULTURE, OB REFLEX

## 2017-10-11 ENCOUNTER — Encounter: Payer: Self-pay | Admitting: Certified Nurse Midwife

## 2017-10-14 ENCOUNTER — Ambulatory Visit (INDEPENDENT_AMBULATORY_CARE_PROVIDER_SITE_OTHER): Payer: No Typology Code available for payment source | Admitting: Certified Nurse Midwife

## 2017-10-14 ENCOUNTER — Encounter: Payer: Self-pay | Admitting: Certified Nurse Midwife

## 2017-10-14 VITALS — BP 122/76 | Wt 180.0 lb

## 2017-10-14 DIAGNOSIS — Z3481 Encounter for supervision of other normal pregnancy, first trimester: Secondary | ICD-10-CM

## 2017-10-14 DIAGNOSIS — Z3480 Encounter for supervision of other normal pregnancy, unspecified trimester: Secondary | ICD-10-CM

## 2017-10-14 DIAGNOSIS — G44201 Tension-type headache, unspecified, intractable: Secondary | ICD-10-CM

## 2017-10-14 NOTE — Patient Instructions (Signed)
Second Trimester of Pregnancy The second trimester is from week 13 through week 28, month 4 through 6. This is often the time in pregnancy that you feel your best. Often times, morning sickness has lessened or quit. You may have more energy, and you may get hungry more often. Your unborn baby (fetus) is growing rapidly. At the end of the sixth month, he or she is about 9 inches long and weighs about 1 pounds. You will likely feel the baby move (quickening) between 18 and 20 weeks of pregnancy. Follow these instructions at home:  Avoid all smoking, herbs, and alcohol. Avoid drugs not approved by your doctor.  Do not use any tobacco products, including cigarettes, chewing tobacco, and electronic cigarettes. If you need help quitting, ask your doctor. You may get counseling or other support to help you quit.  Only take medicine as told by your doctor. Some medicines are safe and some are not during pregnancy.  Exercise only as told by your doctor. Stop exercising if you start having cramps.  Eat regular, healthy meals.  Wear a good support bra if your breasts are tender.  Do not use hot tubs, steam rooms, or saunas.  Wear your seat belt when driving.  Avoid raw meat, uncooked cheese, and liter boxes and soil used by cats.  Take your prenatal vitamins.  Take 1500-2000 milligrams of calcium daily starting at the 20th week of pregnancy until you deliver your baby.  Try taking medicine that helps you poop (stool softener) as needed, and if your doctor approves. Eat more fiber by eating fresh fruit, vegetables, and whole grains. Drink enough fluids to keep your pee (urine) clear or pale yellow.  Take warm water baths (sitz baths) to soothe pain or discomfort caused by hemorrhoids. Use hemorrhoid cream if your doctor approves.  If you have puffy, bulging veins (varicose veins), wear support hose. Raise (elevate) your feet for 15 minutes, 3-4 times a day. Limit salt in your diet.  Avoid heavy  lifting, wear low heals, and sit up straight.  Rest with your legs raised if you have leg cramps or low back pain.  Visit your dentist if you have not gone during your pregnancy. Use a soft toothbrush to brush your teeth. Be gentle when you floss.  You can have sex (intercourse) unless your doctor tells you not to.  Go to your doctor visits. Get help if:  You feel dizzy.  You have mild cramps or pressure in your lower belly (abdomen).  You have a nagging pain in your belly area.  You continue to feel sick to your stomach (nauseous), throw up (vomit), or have watery poop (diarrhea).  You have bad smelling fluid coming from your vagina.  You have pain with peeing (urination). Get help right away if:  You have a fever.  You are leaking fluid from your vagina.  You have spotting or bleeding from your vagina.  You have severe belly cramping or pain.  You lose or gain weight rapidly.  You have trouble catching your breath and have chest pain.  You notice sudden or extreme puffiness (swelling) of your face, hands, ankles, feet, or legs.  You have not felt the baby move in over an hour.  You have severe headaches that do not go away with medicine.  You have vision changes. This information is not intended to replace advice given to you by your health care provider. Make sure you discuss any questions you have with your health care   provider. Document Released: 11/14/2009 Document Revised: 01/26/2016 Document Reviewed: 10/21/2012 Elsevier Interactive Patient Education  2017 [employer] Inc[employer]  

## 2017-10-14 NOTE — Progress Notes (Signed)
Pt c/o headaches. No visual changes.

## 2017-10-14 NOTE — Progress Notes (Signed)
   PRENATAL VISIT NOTE  Subjective:  Rachael [name] is a 32 y.o. G2P1001 at [redacted]w[redacted]d being seen today for ongoing prenatal care.  She is currently monitored for the following issues for this low-risk pregnancy and has Allergy-induced asthma; Eustachian tube dysfunction; Family history of breast cancer; Genetic testing; Family hx of ovarian malignancy; Overweight (BMI 25.0-29.9); and Supervision of other normal pregnancy, antepartum on their problem list.  Patient reports headache.  Contractions: Not present. Vag. Bleeding: None.  Movement: Absent. Denies leaking of fluid.   The following portions of the patient's history were reviewed and updated as appropriate: allergies, current medications, past family history, past medical history, past social history, past surgical history and problem list. Problem list updated.  Objective:   Vitals:   10/14/17 0824  BP: 122/76  Weight: 180 lb (81.6 kg)    Fetal Status: Fetal Heart Rate (bpm): 160   Movement: Absent     General:  Alert, oriented and cooperative. Patient is in no acute distress.  Skin: Skin is warm and dry. No rash noted.   Cardiovascular: Normal heart rate noted  Respiratory: Normal respiratory effort, no problems with respiration noted  Abdomen: Soft, gravid, appropriate for gestational age.  Pain/Pressure: Absent     Pelvic: Cervical exam deferred        Extremities: Normal range of motion.  Edema: None  Mental Status:  Normal mood and affect. Normal behavior. Normal judgment and thought content.   Assessment and Plan:  Pregnancy: G2P1001 at [redacted]w[redacted]d  1. Encounter for supervision of other normal pregnancy in first trimester -Discussed genetic screening during visit. Pt agrees to first trimester screening with NT ultrasound.  - [place] MFM Fetal Nuchal Translucency; Future  2. Supervision of other normal pregnancy, antepartum  3. Acute intractable tension-type headache -Pt c/o headache that occurs mostly in the evening, right before  dinner sometimes after. She has not taken any medication for HA. Reports HA usually goes away after eating. Discussed increasing about of water she is consuming during the day and safe use of Tylenol during pregnancy for management.   Preterm labor symptoms and general obstetric precautions including but not limited to vaginal bleeding, contractions, leaking of fluid and fetal movement were reviewed in detail with the patient. Please refer to After Visit Summary for other counseling recommendations.  Return in about 8 weeks (around 12/09/2017) for ROB.   Lajean Manes, CNM

## 2017-10-15 ENCOUNTER — Encounter: Payer: Self-pay | Admitting: Obstetrics & Gynecology

## 2017-10-16 ENCOUNTER — Encounter (HOSPITAL_COMMUNITY): Payer: Self-pay | Admitting: Certified Nurse Midwife

## 2017-10-23 ENCOUNTER — Other Ambulatory Visit: Payer: Self-pay

## 2017-10-23 ENCOUNTER — Ambulatory Visit (HOSPITAL_COMMUNITY)
Admission: RE | Admit: 2017-10-23 | Discharge: 2017-10-23 | Disposition: A | Discharge status: Home / Self Care | Payer: No Typology Code available for payment source | Source: Ambulatory Visit | Attending: Certified Nurse Midwife | Admitting: Certified Nurse Midwife

## 2017-10-23 ENCOUNTER — Encounter (HOSPITAL_COMMUNITY): Payer: Self-pay

## 2017-10-23 DIAGNOSIS — Z3481 Encounter for supervision of other normal pregnancy, first trimester: Secondary | ICD-10-CM | POA: Diagnosis not present

## 2017-10-29 ENCOUNTER — Encounter: Payer: Self-pay | Admitting: *Deleted

## 2017-12-02 ENCOUNTER — Ambulatory Visit (HOSPITAL_COMMUNITY)
Admission: RE | Admit: 2017-12-02 | Discharge: 2017-12-02 | Disposition: A | Discharge status: Home / Self Care | Payer: No Typology Code available for payment source | Source: Ambulatory Visit | Attending: Advanced Practice Midwife | Admitting: Advanced Practice Midwife

## 2017-12-02 ENCOUNTER — Ambulatory Visit (HOSPITAL_COMMUNITY): Payer: No Typology Code available for payment source

## 2017-12-02 ENCOUNTER — Other Ambulatory Visit: Payer: Self-pay | Admitting: Advanced Practice Midwife

## 2017-12-02 DIAGNOSIS — Z3A19 19 weeks gestation of pregnancy: Secondary | ICD-10-CM | POA: Insufficient documentation

## 2017-12-02 DIAGNOSIS — Z3689 Encounter for other specified antenatal screening: Secondary | ICD-10-CM

## 2017-12-02 DIAGNOSIS — Z3480 Encounter for supervision of other normal pregnancy, unspecified trimester: Secondary | ICD-10-CM

## 2017-12-05 ENCOUNTER — Encounter: Payer: Self-pay | Admitting: Advanced Practice Midwife

## 2017-12-05 DIAGNOSIS — O35EXX0 Maternal care for other (suspected) fetal abnormality and damage, fetal genitourinary anomalies, not applicable or unspecified: Secondary | ICD-10-CM | POA: Insufficient documentation

## 2017-12-05 DIAGNOSIS — O358XX0 Maternal care for other (suspected) fetal abnormality and damage, not applicable or unspecified: Secondary | ICD-10-CM | POA: Insufficient documentation

## 2017-12-12 ENCOUNTER — Ambulatory Visit (INDEPENDENT_AMBULATORY_CARE_PROVIDER_SITE_OTHER): Payer: No Typology Code available for payment source | Admitting: Obstetrics & Gynecology

## 2017-12-12 VITALS — BP 107/61 | HR 82 | Wt 192.0 lb

## 2017-12-12 DIAGNOSIS — Z3482 Encounter for supervision of other normal pregnancy, second trimester: Secondary | ICD-10-CM

## 2017-12-12 DIAGNOSIS — E663 Overweight: Secondary | ICD-10-CM

## 2017-12-12 DIAGNOSIS — Z3480 Encounter for supervision of other normal pregnancy, unspecified trimester: Secondary | ICD-10-CM

## 2017-12-12 NOTE — Progress Notes (Signed)
   PRENATAL VISIT NOTE  Subjective:  Rachael [name] is a 32 y.o. G2P1001 at [redacted]w[redacted]d being seen today for ongoing prenatal care.  She is currently monitored for the following issues for this low-risk pregnancy and has Allergy-induced asthma; Eustachian tube dysfunction; Family history of breast cancer; Genetic testing; Family hx of ovarian malignancy; Overweight (BMI 25.0-29.9); Supervision of other normal pregnancy, antepartum; and Pyelectasis of fetus on prenatal ultrasound on their problem list.  Patient reports no complaints.  Contractions: Not present. Vag. Bleeding: None.  Movement: Present. Denies leaking of fluid.   The following portions of the patient's history were reviewed and updated as appropriate: allergies, current medications, past family history, past medical history, past social history, past surgical history and problem list. Problem list updated.  Objective:   Vitals:   12/12/17 1322  BP: 107/61  Pulse: 82  Weight: 192 lb (87.1 kg)    Fetal Status: Fetal Heart Rate (bpm): 150   Movement: Present     General:  Alert, oriented and cooperative. Patient is in no acute distress.  Skin: Skin is warm and dry. No rash noted.   Cardiovascular: Normal heart rate noted  Respiratory: Normal respiratory effort, no problems with respiration noted  Abdomen: Soft, gravid, appropriate for gestational age.  Pain/Pressure: Absent     Pelvic: Cervical exam deferred        Extremities: Normal range of motion.  Edema: None  Mental Status: Normal mood and affect. Normal behavior. Normal judgment and thought content.   Assessment and Plan:  Pregnancy: G2P1001 at [redacted]w[redacted]d  1. Supervision of other normal pregnancy, antepartum  - Alpha fetoprotein, maternal  2. Overweight (BMI 25.0-29.9) - 2 hour GTT and routine labs at next visit  Preterm labor symptoms and general obstetric precautions including but not limited to vaginal bleeding, contractions, leaking of fluid and fetal movement were  reviewed in detail with the patient. Please refer to After Visit Summary for other counseling recommendations.  Return in about 8 weeks (around 02/06/2018).  Future Appointments  Date Time Provider Department Center  02/12/2018  8:15 AM Emily Filbert, MD [id number]    Emily Filbert, MD

## 2017-12-13 ENCOUNTER — Telehealth: Payer: Self-pay

## 2017-12-13 LAB — ALPHA FETOPROTEIN, MATERNAL
AFP MOM: 1.51
AFP, Serum: 80.7 ng/mL
Calc'd Gestational Age: 20.6 weeks
MATERNAL WT: 183 [lb_av]
Risk for ONTD: 1
TWINS-AFP: 1

## 2017-12-13 NOTE — Telephone Encounter (Signed)
Spoke with pt and she is aware AFP was negative

## 2018-01-10 ENCOUNTER — Encounter: Payer: Self-pay | Admitting: Physician Assistant

## 2018-01-10 ENCOUNTER — Ambulatory Visit (INDEPENDENT_AMBULATORY_CARE_PROVIDER_SITE_OTHER): Payer: No Typology Code available for payment source | Admitting: Physician Assistant

## 2018-01-10 VITALS — BP 109/66 | HR 76 | Temp 98.5°F | Wt 195.4 lb

## 2018-01-10 DIAGNOSIS — D229 Melanocytic nevi, unspecified: Secondary | ICD-10-CM | POA: Diagnosis not present

## 2018-01-10 DIAGNOSIS — Z0000 Encounter for general adult medical examination without abnormal findings: Secondary | ICD-10-CM

## 2018-01-10 DIAGNOSIS — Z131 Encounter for screening for diabetes mellitus: Secondary | ICD-10-CM

## 2018-01-10 DIAGNOSIS — L989 Disorder of the skin and subcutaneous tissue, unspecified: Secondary | ICD-10-CM

## 2018-01-10 NOTE — Patient Instructions (Signed)

## 2018-01-10 NOTE — Progress Notes (Signed)
Subjective:     Rachael [name] is a 32 y.o. female and is here for a comprehensive physical exam. The patient reports problems - pt is [redacted] weeks pregnant and doing well. she is concerned about 2 moles on her back. her maternal grandfather had BCC. no other family or personal hx of skin cancer. .  Social History   Socioeconomic History  . Marital status: Married    Spouse name: Rachael [name]  . Number of children: 0  . Years of education: Not on file  . Highest education level: Not on file  Occupational History  . Occupation: [employer]    Employer: [employer]  Social Needs  . Financial resource strain: Not on file  . Food insecurity:    Worry: Not on file    Inability: Not on file  . Transportation needs:    Medical: Not on file    Non-medical: Not on file  Tobacco Use  . Smoking status: Never Smoker  . Smokeless tobacco: Never Used  Substance and Sexual Activity  . Alcohol use: Yes    Comment: social use  . Drug use: No  . Sexual activity: Yes    Birth control/protection: None  Lifestyle  . Physical activity:    Days per week: Not on file    Minutes per session: Not on file  . Stress: Not on file  Relationships  . Social connections:    Talks on phone: Not on file    Gets together: Not on file    Attends religious service: Not on file    Active member of club or organization: Not on file    Attends meetings of clubs or organizations: Not on file    Relationship status: Not on file  . Intimate partner violence:    Fear of current or ex partner: Not on file    Emotionally abused: Not on file    Physically abused: Not on file    Forced sexual activity: Not on file  Other Topics Concern  . Not on file  Social History Narrative   Lives with husband   1 dog   Expecting first child   Works at [employer] ([place])   Working on Museum/gallery exhibitions officer at [employer] Date Due  . INFLUENZA VACCINE  04/03/2018  . PAP  SMEAR  05/22/2020  . TETANUS/TDAP  03/31/2024  . HIV Screening  Completed    The following portions of the patient's history were reviewed and updated as appropriate: allergies, current medications, past family history, past medical history, past social history, past surgical history and problem list.  Review of Systems Pertinent items noted in HPI and remainder of comprehensive ROS otherwise negative.   Objective:    BP 109/66 (BP Location: Left Arm, Patient Position: Sitting, Cuff Size: Normal)   Pulse 76   Temp 98.5 F (36.9 C) (Oral)   Wt 195 lb 6.4 oz (88.6 kg)   LMP 07/21/2017   BMI 31.54 kg/m  General appearance: alert, cooperative and appears stated age Head: Normocephalic, without obvious abnormality, atraumatic Eyes: conjunctivae/corneas clear. PERRL, EOM's intact. Fundi benign. Ears: normal TM's and external ear canals both ears Nose: Nares normal. Septum midline. Mucosa normal. No drainage or sinus tenderness. Throat: lips, mucosa, and tongue normal; teeth and gums normal Neck: no adenopathy, no carotid bruit, no JVD, supple, symmetrical, trachea midline and thyroid not enlarged, symmetric, no tenderness/mass/nodules Back: symmetric, no curvature. ROM normal. No CVA tenderness. Lungs: clear to auscultation  bilaterally Heart: regular rate and rhythm, S1, S2 normal, no murmur, click, rub or gallop Abdomen: soft, non-tender; bowel sounds normal; no masses,  no organomegaly Extremities: extremities normal, atraumatic, no cyanosis or edema Pulses: 2+ and symmetric Skin: Skin color, texture, turgor normal. No rashes or lesions 1cm by 1cm pigmented raised nevus asymmetric with color of mid left back. 76mm by 52mm pigmented raised nevus of right mid back.  Lymph nodes: Cervical, supraclavicular, and axillary nodes normal. Neurologic: Alert and oriented X 3, normal strength and tone. Normal symmetric reflexes. Normal coordination and gait    Assessment:    Healthy female exam.       Plan:  Marland KitchenMarland KitchenMidge was seen today for annual exam.  Diagnoses and all orders for this visit:  Routine physical examination -     COMPLETE METABOLIC PANEL WITH GFR  Screening for diabetes mellitus -     COMPLETE METABOLIC PANEL WITH GFR  Skin mole -     Dermatology pathology   .[name] Depression screen [facility] 2/9 01/10/2018 06/27/2017  Decreased Interest 0 0  Down, Depressed, Hopeless 0 0  PHQ - 2 Score 0 0  Altered sleeping 0 -  Tired, decreased energy 0 -  Change in appetite 0 -  Feeling bad or failure about yourself  0 -  Trouble concentrating 0 -  Moving slowly or fidgety/restless 0 -  Suicidal thoughts 0 -  PHQ-9 Score 0 -  Difficult doing work/chores Not difficult at all -   .. Discussed 150 minutes of exercise a week.  Encouraged vitamin D 1000 units and Calcium 1300mg  or 4 servings of dairy a day.  Vaccines up to date.  Fasting labs would be inaccurate being pregnant. cmp ordered.  On prenatal. No complications.   Cryotherapy Procedure Note  Pre-operative Diagnosis: Dysplastic nevi  Post-operative Diagnosis: Dysplastic nevi  Locations: right mid back  Indications: irritated by bra  Procedure Details  History of allergy to iodine: no. Pacemaker? no.  Patient informed of risks (permanent scarring, infection, light or dark discoloration, bleeding, infection, weakness, numbness and recurrence of the lesion) and benefits of the procedure and verbal informed consent obtained.  The areas are treated with liquid nitrogen therapy, frozen until ice ball extended 2 mm beyond lesion, allowed to thaw, and treated again. The patient tolerated procedure well.  The patient was instructed on post-op care, warned that there may be blister formation, redness and pain. Recommend OTC analgesia as needed for pain.  Condition: Stable  Complications: none.  Plan: 1. Instructed to keep the area dry and covered for 24-48h and clean thereafter. 2. Warning signs of infection were  reviewed.   3. Recommended that the patient use OTC acetaminophen as needed for pain.    Shave Biopsy Procedure Note  Pre-operative Diagnosis: Suspicious lesion  Post-operative Diagnosis: same  Locations:lef mid back  Indications: irregular asymetric color of mole  Anesthesia: Lidocaine 2% with epinephrine without added sodium bicarbonate  Procedure Details  History of allergy to iodine: no  Patient informed of the risks (including bleeding and infection) and benefits of the  procedure and Verbal informed consent obtained.  The lesion and surrounding area were given a sterile prep using chlorhexidine and draped in the usual sterile fashion. A scalpel was used to shave an area of skin approximately 1cm by 1cm.  Hemostasis achieved with alumuninum chloride. Antibiotic ointment and a sterile dressing applied.  The specimen was sent for pathologic examination. The patient tolerated the procedure well.  EBL: scant  Condition:  Stable  Complications: none.  Plan: 1. Instructed to keep the wound dry and covered for 24-48h and clean thereafter. 2. Warning signs of infection were reviewed.   3. Recommended that the patient use OTC acetaminophen as needed for pain.     See After Visit Summary for Counseling Recommendations

## 2018-01-11 LAB — COMPLETE METABOLIC PANEL WITH GFR
AG RATIO: 1.2 (calc) (ref 1.0–2.5)
ALT: 9 U/L (ref 6–29)
AST: 10 U/L (ref 10–30)
Albumin: 3.7 g/dL (ref 3.6–5.1)
Alkaline phosphatase (APISO): 48 U/L (ref 33–115)
BUN/Creatinine Ratio: 16 (calc) (ref 6–22)
BUN: 8 mg/dL (ref 7–25)
CO2: 24 mmol/L (ref 20–32)
Calcium: 9.4 mg/dL (ref 8.6–10.2)
Chloride: 103 mmol/L (ref 98–110)
Creat: 0.49 mg/dL — ABNORMAL LOW (ref 0.50–1.10)
GFR, EST NON AFRICAN AMERICAN: 131 mL/min/{1.73_m2} (ref >=60)
GFR, Est African American: 152 mL/min/{1.73_m2} (ref >=60)
Globulin: 3 g/dL (calc) (ref 1.9–3.7)
Glucose, Bld: 76 mg/dL (ref 65–99)
Potassium: 3.9 mmol/L (ref 3.5–5.3)
Sodium: 136 mmol/L (ref 135–146)
Total Bilirubin: 0.4 mg/dL (ref 0.2–1.2)
Total Protein: 6.7 g/dL (ref 6.1–8.1)

## 2018-01-12 DIAGNOSIS — D229 Melanocytic nevi, unspecified: Secondary | ICD-10-CM | POA: Insufficient documentation

## 2018-01-12 NOTE — Progress Notes (Signed)
Call pt: labs look good.

## 2018-01-14 NOTE — Progress Notes (Signed)
Call pt: just a pigmented mole. No atypia. Great news.

## 2018-02-05 ENCOUNTER — Ambulatory Visit (INDEPENDENT_AMBULATORY_CARE_PROVIDER_SITE_OTHER): Payer: No Typology Code available for payment source | Admitting: Obstetrics & Gynecology

## 2018-02-05 VITALS — BP 110/74 | HR 93 | Wt 197.0 lb

## 2018-02-05 DIAGNOSIS — Z23 Encounter for immunization: Secondary | ICD-10-CM | POA: Diagnosis not present

## 2018-02-05 DIAGNOSIS — Z3480 Encounter for supervision of other normal pregnancy, unspecified trimester: Secondary | ICD-10-CM

## 2018-02-05 DIAGNOSIS — Z3483 Encounter for supervision of other normal pregnancy, third trimester: Secondary | ICD-10-CM

## 2018-02-05 NOTE — Progress Notes (Signed)
   PRENATAL VISIT NOTE  Subjective:  Rachael [name] is a 32 y.o. G2P1001 at [redacted]w[redacted]d being seen today for ongoing prenatal care.  She is currently monitored for the following issues for this low-risk pregnancy and has Allergy-induced asthma; Eustachian tube dysfunction; Family history of breast cancer; Genetic testing; Family hx of ovarian malignancy; Overweight (BMI 25.0-29.9); Supervision of other normal pregnancy, antepartum; Pyelectasis of fetus on prenatal ultrasound; Skin mole; and Suspicious nevus on their problem list.  Patient reports no complaints.  Contractions: Not present. Vag. Bleeding: None.  Movement: Present. Denies leaking of fluid.   The following portions of the patient's history were reviewed and updated as appropriate: allergies, current medications, past family history, past medical history, past social history, past surgical history and problem list. Problem list updated.  Objective:   Vitals:   02/05/18 0810  BP: 110/74  Pulse: 93  Weight: 197 lb (89.4 kg)    Fetal Status: Fetal Heart Rate (bpm): 151   Movement: Present     General:  Alert, oriented and cooperative. Patient is in no acute distress.  Skin: Skin is warm and dry. No rash noted.   Cardiovascular: Normal heart rate noted  Respiratory: Normal respiratory effort, no problems with respiration noted  Abdomen: Soft, gravid, appropriate for gestational age.  Pain/Pressure: Absent     Pelvic: Cervical exam deferred        Extremities: Normal range of motion.  Edema: None  Mental Status: Normal mood and affect. Normal behavior. Normal judgment and thought content.   Assessment and Plan:  Pregnancy: G2P1001 at [redacted]w[redacted]d  1. Supervision of other normal pregnancy, antepartum  - 2Hr GTT w/ 1 Hr Carpenter 75 g - HIV antibody (with reflex) - CBC - RPR - Tdap vaccine greater than or equal to 7yo IM  Preterm labor symptoms and general obstetric precautions including but not limited to vaginal bleeding,  contractions, leaking of fluid and fetal movement were reviewed in detail with the patient. Please refer to After Visit Summary for other counseling recommendations.  Return in about 1 month (around 03/05/2018).  Future Appointments  Date Time Provider [place]  02/05/2018  8:30 AM Emily Filbert, MD [facility]    Emily Filbert, MD

## 2018-02-06 LAB — CBC
HCT: 35 % (ref 35.0–45.0)
HEMOGLOBIN: 11.9 g/dL (ref 11.7–15.5)
MCH: 29.8 pg (ref 27.0–33.0)
MCHC: 34 g/dL (ref 32.0–36.0)
MCV: 87.5 fL (ref 80.0–100.0)
MPV: 10.2 fL (ref 7.5–12.5)
Platelets: 222 10*3/uL (ref 140–400)
RBC: 4 10*6/uL (ref 3.80–5.10)
RDW: 12.8 % (ref 11.0–15.0)
WBC: 9.7 10*3/uL (ref 3.8–10.8)

## 2018-02-06 LAB — 2HR GTT W 1 HR, CARPENTER, 75 G
GLUCOSE, FASTING, GEST: 70 mg/dL (ref 65–91)
Glucose, 1 Hr, Gest: 131 mg/dL (ref 65–179)
Glucose, 2 Hr, Gest: 119 mg/dL (ref 65–152)

## 2018-02-06 LAB — RPR: RPR Ser Ql: NONREACTIVE

## 2018-02-06 LAB — HIV ANTIBODY (ROUTINE TESTING W REFLEX): HIV: NONREACTIVE

## 2018-02-12 ENCOUNTER — Encounter: Payer: No Typology Code available for payment source | Admitting: Obstetrics & Gynecology

## 2018-03-03 ENCOUNTER — Ambulatory Visit (INDEPENDENT_AMBULATORY_CARE_PROVIDER_SITE_OTHER): Payer: No Typology Code available for payment source | Admitting: Advanced Practice Midwife

## 2018-03-03 ENCOUNTER — Encounter: Payer: Self-pay | Admitting: Advanced Practice Midwife

## 2018-03-03 VITALS — BP 116/74 | HR 90 | Wt 203.0 lb

## 2018-03-03 DIAGNOSIS — O283 Abnormal ultrasonic finding on antenatal screening of mother: Secondary | ICD-10-CM

## 2018-03-03 DIAGNOSIS — Z3480 Encounter for supervision of other normal pregnancy, unspecified trimester: Secondary | ICD-10-CM

## 2018-03-03 NOTE — Patient Instructions (Signed)

## 2018-03-03 NOTE — Progress Notes (Signed)
   PRENATAL VISIT NOTE  Subjective:  Rachael [name] is a 32 y.o. G2P1001 at [redacted]w[redacted]d being seen today for ongoing prenatal care.  She is currently monitored for the following issues for this low-risk pregnancy and has Allergy-induced asthma; Eustachian tube dysfunction; Family history of breast cancer; Genetic testing; Family hx of ovarian malignancy; Overweight (BMI 25.0-29.9); Supervision of other normal pregnancy, antepartum; Pyelectasis of fetus on prenatal ultrasound; Skin mole; and Suspicious nevus on their problem list.  Patient reports no complaints.  Contractions: Not present. Vag. Bleeding: None.  Movement: Present. Denies leaking of fluid.   The following portions of the patient's history were reviewed and updated as appropriate: allergies, current medications, past family history, past medical history, past social history, past surgical history and problem list. Problem list updated.  Objective:   Vitals:   03/03/18 0808  BP: 116/74  Pulse: 90  Weight: 203 lb (92.1 kg)    Fetal Status:     Movement: Present     General:  Alert, oriented and cooperative. Patient is in no acute distress.  Skin: Skin is warm and dry. No rash noted.   Cardiovascular: Normal heart rate noted  Respiratory: Normal respiratory effort, no problems with respiration noted  Abdomen: Soft, gravid, appropriate for gestational age.  Pain/Pressure: Absent     Pelvic: Cervical exam deferred        Extremities: Normal range of motion.  Edema: None  Mental Status: Normal mood and affect. Normal behavior. Normal judgment and thought content.   Assessment and Plan:  Pregnancy: G2P1001 at [redacted]w[redacted]d  1. Abnormal fetal ultrasound     Never had followup US re: Left UTD.  Will schedule.  - [place] MFM OB FOLLOW UP; Future  2. Supervision of other normal pregnancy, antepartum     Box updated     Babyscripts schedule     Reviewed normal GTT  Preterm labor symptoms and general obstetric precautions including but not  limited to vaginal bleeding, contractions, leaking of fluid and fetal movement were reviewed in detail with the patient. Please refer to After Visit Summary for other counseling recommendations.  Return in about 1 month (around 03/31/2018) for [employer].  Future Appointments  Date Time Provider [place]  04/02/2018  8:15 AM Guss Bunde, MD [facility]    Hansel Feinstein, CNM

## 2018-03-03 NOTE — Progress Notes (Deleted)
   PRENATAL VISIT NOTE  Subjective:  Rachael [name] is a 32 y.o. G2P1001 at [redacted]w[redacted]d being seen today for ongoing prenatal care.  She is currently monitored for the following issues for this {Blank single:19197::"high-risk","low-risk"} pregnancy and has Allergy-induced asthma; Eustachian tube dysfunction; Family history of breast cancer; Genetic testing; Family hx of ovarian malignancy; Overweight (BMI 25.0-29.9); Supervision of other normal pregnancy, antepartum; Pyelectasis of fetus on prenatal ultrasound; Skin mole; and Suspicious nevus on their problem list.  Patient reports {sx:14538}.  Contractions: Not present. Vag. Bleeding: None.  Movement: Present. Denies leaking of fluid.   The following portions of the patient's history were reviewed and updated as appropriate: allergies, current medications, past family history, past medical history, past social history, past surgical history and problem list. Problem list updated.  Objective:   Vitals:   03/03/18 0808  BP: 116/74  Pulse: 90  Weight: 203 lb (92.1 kg)    Fetal Status:     Movement: Present     General:  Alert, oriented and cooperative. Patient is in no acute distress.  Skin: Skin is warm and dry. No rash noted.   Cardiovascular: Normal heart rate noted  Respiratory: Normal respiratory effort, no problems with respiration noted  Abdomen: Soft, gravid, appropriate for gestational age.  Pain/Pressure: Absent     Pelvic: {Blank single:19197::"Cervical exam performed","Cervical exam deferred"}        Extremities: Normal range of motion.  Edema: None  Mental Status: Normal mood and affect. Normal behavior. Normal judgment and thought content.   Assessment and Plan:  Pregnancy: G2P1001 at [redacted]w[redacted]d  1. Abnormal fetal ultrasound *** - [place] MFM OB FOLLOW UP; Future  2. Supervision of other normal pregnancy, antepartum ***  {Blank single:19197::"Term","Preterm"} labor symptoms and general obstetric precautions including but not  limited to vaginal bleeding, contractions, leaking of fluid and fetal movement were reviewed in detail with the patient. Please refer to After Visit Summary for other counseling recommendations.  Return in about 1 month (around 03/31/2018) for [employer].  Future Appointments  Date Time Provider [place]  04/02/2018  8:15 AM Guss Bunde, MD [facility]    Hansel Feinstein, CNM

## 2018-03-25 ENCOUNTER — Ambulatory Visit (HOSPITAL_COMMUNITY): Payer: No Typology Code available for payment source

## 2018-03-28 ENCOUNTER — Ambulatory Visit (HOSPITAL_COMMUNITY)
Admission: RE | Admit: 2018-03-28 | Discharge: 2018-03-28 | Disposition: A | Discharge status: Home / Self Care | Payer: No Typology Code available for payment source | Source: Ambulatory Visit | Attending: Advanced Practice Midwife | Admitting: Advanced Practice Midwife

## 2018-03-28 DIAGNOSIS — Z362 Encounter for other antenatal screening follow-up: Secondary | ICD-10-CM

## 2018-03-28 DIAGNOSIS — Z3A35 35 weeks gestation of pregnancy: Secondary | ICD-10-CM | POA: Insufficient documentation

## 2018-03-28 DIAGNOSIS — O283 Abnormal ultrasonic finding on antenatal screening of mother: Secondary | ICD-10-CM | POA: Diagnosis not present

## 2018-04-02 ENCOUNTER — Ambulatory Visit (INDEPENDENT_AMBULATORY_CARE_PROVIDER_SITE_OTHER): Payer: No Typology Code available for payment source | Admitting: Obstetrics & Gynecology

## 2018-04-02 DIAGNOSIS — O358XX0 Maternal care for other (suspected) fetal abnormality and damage, not applicable or unspecified: Secondary | ICD-10-CM

## 2018-04-02 DIAGNOSIS — Z113 Encounter for screening for infections with a predominantly sexual mode of transmission: Secondary | ICD-10-CM

## 2018-04-02 DIAGNOSIS — O35EXX0 Maternal care for other (suspected) fetal abnormality and damage, fetal genitourinary anomalies, not applicable or unspecified: Secondary | ICD-10-CM

## 2018-04-02 DIAGNOSIS — Z3483 Encounter for supervision of other normal pregnancy, third trimester: Secondary | ICD-10-CM

## 2018-04-02 DIAGNOSIS — Z3480 Encounter for supervision of other normal pregnancy, unspecified trimester: Secondary | ICD-10-CM

## 2018-04-02 LAB — OB RESULTS CONSOLE GBS: GBS: POSITIVE

## 2018-04-02 NOTE — Progress Notes (Signed)
   PRENATAL VISIT NOTE  Subjective:  Rachael [name] is a 32 y.o. G2P1001 at [redacted]w[redacted]d being seen today for ongoing prenatal care.  She is currently monitored for the following issues for this low-risk pregnancy and has Allergy-induced asthma; Eustachian tube dysfunction; Family history of breast cancer; Genetic testing; Family hx of ovarian malignancy; Overweight (BMI 25.0-29.9); Supervision of other normal pregnancy, antepartum; Pyelectasis of fetus on prenatal ultrasound; Skin mole; and Suspicious nevus on their problem list.  Patient reports no complaints.  Contractions: Not present. Vag. Bleeding: None.  Movement: Present. Denies leaking of fluid.   The following portions of the patient's history were reviewed and updated as appropriate: allergies, current medications, past family history, past medical history, past social history, past surgical history and problem list. Problem list updated.  Objective:   Vitals:   04/02/18 0809  BP: 113/70  Pulse: 96  Weight: 209 lb (94.8 kg)    Fetal Status: Fetal Heart Rate (bpm): 155 Fundal Height: 34 cm Movement: Present  Presentation: Vertex  General:  Alert, oriented and cooperative. Patient is in no acute distress.  Skin: Skin is warm and dry. No rash noted.   Cardiovascular: Normal heart rate noted  Respiratory: Normal respiratory effort, no problems with respiration noted  Abdomen: Soft, gravid, appropriate for gestational age.  Pain/Pressure: Present     Pelvic: Cervical exam performed Dilation: 1.5 Effacement (%): 30 Station: Ballotable  Extremities: Normal range of motion.  Edema: None  Mental Status: Normal mood and affect. Normal behavior. Normal judgment and thought content.   Assessment and Plan:  Pregnancy: G2P1001 at [redacted]w[redacted]d  1. Supervision of other normal pregnancy, antepartum _ Baby Rx data not crossing.  All BPs are nml and she is compliant.  Rn Clark to call Brx and see what is happening. - Culture, beta strep (group b  only) - GC/Chlamydia probe amp ([facility])not at [facility]  2. Pyelectasis of fetus on prenatal ultrasound Resolved 86% on [place] although pt feels smaller and fundal height is 2 cm behind.   Pt had NSVD 8+ lb baby without issue.   Term labor symptoms and general obstetric precautions including but not limited to vaginal bleeding, contractions, leaking of fluid and fetal movement were reviewed in detail with the patient. Please refer to After Visit Summary for other counseling recommendations.  Return in about 2 weeks (around 04/16/2018).  No future appointments.  Silas [place], MD

## 2018-04-04 LAB — GC/CHLAMYDIA PROBE AMP (~~LOC~~) NOT AT ARMC
Chlamydia: NEGATIVE
NEISSERIA GONORRHEA: NEGATIVE

## 2018-04-05 LAB — CULTURE, BETA STREP (GROUP B ONLY)
MICRO NUMBER:: 90905304
SPECIMEN QUALITY:: ADEQUATE

## 2018-04-16 ENCOUNTER — Ambulatory Visit (INDEPENDENT_AMBULATORY_CARE_PROVIDER_SITE_OTHER): Payer: No Typology Code available for payment source | Admitting: Obstetrics & Gynecology

## 2018-04-16 VITALS — BP 116/71 | HR 85 | Wt 211.0 lb

## 2018-04-16 DIAGNOSIS — Z3483 Encounter for supervision of other normal pregnancy, third trimester: Secondary | ICD-10-CM

## 2018-04-16 DIAGNOSIS — Z3480 Encounter for supervision of other normal pregnancy, unspecified trimester: Secondary | ICD-10-CM

## 2018-04-16 NOTE — Progress Notes (Signed)
   PRENATAL VISIT NOTE  Subjective:  Rachael [name] is a 32 y.o. G2P1001 at [redacted]w[redacted]d being seen today for ongoing prenatal care.  She is currently monitored for the following issues for this low-risk pregnancy and has Allergy-induced asthma; Eustachian tube dysfunction; Family history of breast cancer; Genetic testing; Family hx of ovarian malignancy; Overweight (BMI 25.0-29.9); Supervision of other normal pregnancy, antepartum; Pyelectasis of fetus on prenatal ultrasound; Skin mole; and Suspicious nevus on their problem list.  Patient reports no complaints.  Contractions: Not present. Vag. Bleeding: None.  Movement: Present. Denies leaking of fluid.   The following portions of the patient's history were reviewed and updated as appropriate: allergies, current medications, past family history, past medical history, past social history, past surgical history and problem list. Problem list updated.  Objective:   Vitals:   04/16/18 1056  BP: 116/71  Pulse: 85  Weight: 211 lb (95.7 kg)    Fetal Status: Fetal Heart Rate (bpm): 138   Movement: Present     General:  Alert, oriented and cooperative. Patient is in no acute distress.  Skin: Skin is warm and dry. No rash noted.   Cardiovascular: Normal heart rate noted  Respiratory: Normal respiratory effort, no problems with respiration noted  Abdomen: Soft, gravid, appropriate for gestational age.  Pain/Pressure: Present     Pelvic: Cervical exam performed        Extremities: Normal range of motion.  Edema: Trace  Mental Status: Normal mood and affect. Normal behavior. Normal judgment and thought content.   Assessment and Plan:  Pregnancy: G2P1001 at [redacted]w[redacted]d  1. Supervision of other normal pregnancy, antepartum   Preterm labor symptoms and general obstetric precautions including but not limited to vaginal bleeding, contractions, leaking of fluid and fetal movement were reviewed in detail with the patient. Please refer to After Visit Summary for  other counseling recommendations.  Return in about 1 week (around 04/23/2018).  No future appointments.  Emily Filbert, MD

## 2018-04-21 ENCOUNTER — Ambulatory Visit (INDEPENDENT_AMBULATORY_CARE_PROVIDER_SITE_OTHER): Payer: No Typology Code available for payment source | Admitting: Advanced Practice Midwife

## 2018-04-21 VITALS — BP 126/82 | HR 98 | Wt 212.0 lb

## 2018-04-21 DIAGNOSIS — Z3480 Encounter for supervision of other normal pregnancy, unspecified trimester: Secondary | ICD-10-CM

## 2018-04-21 NOTE — Progress Notes (Signed)
   PRENATAL VISIT NOTE  Subjective:  Rachael [name] is a 32 y.o. G2P1001 at [redacted]w[redacted]d being seen today for ongoing prenatal care.  She is currently monitored for the following issues for this low-risk pregnancy and has Allergy-induced asthma; Eustachian tube dysfunction; Family history of breast cancer; Genetic testing; Family hx of ovarian malignancy; Overweight (BMI 25.0-29.9); Supervision of other normal pregnancy, antepartum; Pyelectasis of fetus on prenatal ultrasound; Skin mole; and Suspicious nevus on their problem list.  Patient reports occasional contractions.  Contractions: Not present. Vag. Bleeding: None.  Movement: Present. Denies leaking of fluid.   The following portions of the patient's history were reviewed and updated as appropriate: allergies, current medications, past family history, past medical history, past social history, past surgical history and problem list. Problem list updated.  Objective:   Vitals:   04/21/18 1036  BP: 126/82  Pulse: 98  Weight: 96.2 kg    Fetal Status: Fetal Heart Rate (bpm): 140   Movement: Present  Presentation: Vertex  General:  Alert, oriented and cooperative. Patient is in no acute distress.  Skin: Skin is warm and dry. No rash noted.   Cardiovascular: Normal heart rate noted  Respiratory: Normal respiratory effort, no problems with respiration noted  Abdomen: Soft, gravid, appropriate for gestational age.  Pain/Pressure: Present     Pelvic: Cervical exam performed Dilation: 3 Effacement (%): 50 Station: -2  Extremities: Normal range of motion.  Edema: Trace  Mental Status: Normal mood and affect. Normal behavior. Normal judgment and thought content.   Assessment and Plan:  Pregnancy: G2P1001 at [redacted]w[redacted]d  1. Supervision of other normal pregnancy, antepartum --Anticipatory guidance about next visits/weeks of pregnancy given. --Membranes swept with cervical exam today at pt request.  Discussed [place] with pt to encourage optimal  fetal positioning for labor. --Initial BP elevated after pt walked up stairs. Retake wnl. Preeclampsia precautions reviewed.  Term labor symptoms and general obstetric precautions including but not limited to vaginal bleeding, contractions, leaking of fluid and fetal movement were reviewed in detail with the patient. Please refer to After Visit Summary for other counseling recommendations.  Return in about 1 week (around 04/28/2018).  No future appointments.  Fatima Blank, CNM

## 2018-04-21 NOTE — Patient Instructions (Signed)

## 2018-04-21 NOTE — Progress Notes (Signed)
Will retake BP before pt leaves

## 2018-04-26 ENCOUNTER — Inpatient Hospital Stay (HOSPITAL_COMMUNITY): Payer: No Typology Code available for payment source | Admitting: Anesthesiology

## 2018-04-26 ENCOUNTER — Encounter (HOSPITAL_COMMUNITY): Payer: Self-pay | Admitting: *Deleted

## 2018-04-26 ENCOUNTER — Other Ambulatory Visit: Payer: Self-pay

## 2018-04-26 ENCOUNTER — Inpatient Hospital Stay (HOSPITAL_COMMUNITY)
Admission: AD | Admit: 2018-04-26 | Discharge: 2018-04-26 | DRG: 807 | Disposition: A | Discharge status: Home / Self Care | Payer: No Typology Code available for payment source | Attending: Obstetrics and Gynecology | Admitting: Obstetrics and Gynecology

## 2018-04-26 DIAGNOSIS — Z3A39 39 weeks gestation of pregnancy: Secondary | ICD-10-CM

## 2018-04-26 DIAGNOSIS — Z3483 Encounter for supervision of other normal pregnancy, third trimester: Secondary | ICD-10-CM | POA: Diagnosis present

## 2018-04-26 DIAGNOSIS — O358XX0 Maternal care for other (suspected) fetal abnormality and damage, not applicable or unspecified: Secondary | ICD-10-CM

## 2018-04-26 DIAGNOSIS — O99824 Streptococcus B carrier state complicating childbirth: Secondary | ICD-10-CM | POA: Diagnosis present

## 2018-04-26 DIAGNOSIS — E669 Obesity, unspecified: Secondary | ICD-10-CM | POA: Diagnosis present

## 2018-04-26 DIAGNOSIS — O99214 Obesity complicating childbirth: Secondary | ICD-10-CM | POA: Diagnosis present

## 2018-04-26 DIAGNOSIS — O35EXX0 Maternal care for other (suspected) fetal abnormality and damage, fetal genitourinary anomalies, not applicable or unspecified: Secondary | ICD-10-CM

## 2018-04-26 LAB — CBC
HCT: 37.5 % (ref 36.0–46.0)
HEMOGLOBIN: 12.5 g/dL (ref 12.0–15.0)
MCH: 30.4 pg (ref 26.0–34.0)
MCHC: 33.3 g/dL (ref 30.0–36.0)
MCV: 91.2 fL (ref 78.0–100.0)
Platelets: 223 10*3/uL (ref 150–400)
RBC: 4.11 MIL/uL (ref 3.87–5.11)
RDW: 13.3 % (ref 11.5–15.5)
WBC: 13 10*3/uL — AB (ref 4.0–10.5)

## 2018-04-26 LAB — RPR: RPR: NONREACTIVE

## 2018-04-26 LAB — TYPE AND SCREEN
ABO/RH(D): A POS
Antibody Screen: NEGATIVE

## 2018-04-26 MED ORDER — ACETAMINOPHEN 325 MG PO TABS: 650.0000 mg | ORAL_TABLET | ORAL | Status: DC | PRN

## 2018-04-26 MED ORDER — DIPHENHYDRAMINE HCL 25 MG PO CAPS: 25.0000 mg | ORAL_CAPSULE | Freq: Four times a day (QID) | ORAL | Status: DC | PRN

## 2018-04-26 MED ORDER — BENZOCAINE-MENTHOL 20-0.5 % EX AERO
1.0000 "application " | INHALATION_SPRAY | CUTANEOUS | Status: DC | PRN
  Administered 2018-04-26: 1 via TOPICAL
  Filled 2018-04-26: qty 56

## 2018-04-26 MED ORDER — IBUPROFEN 600 MG PO TABS
600.0000 mg | ORAL_TABLET | Freq: Four times a day (QID) | ORAL | Status: DC
  Administered 2018-04-26 – 2018-04-27 (×5): 600 mg via ORAL
  Filled 2018-04-26 (×5): qty 1

## 2018-04-26 MED ORDER — SENNOSIDES-DOCUSATE SODIUM 8.6-50 MG PO TABS
2.0000 | ORAL_TABLET | ORAL | Status: DC
  Administered 2018-04-26: 2 via ORAL
  Filled 2018-04-26: qty 2

## 2018-04-26 MED ORDER — OXYCODONE-ACETAMINOPHEN 5-325 MG PO TABS: 1.0000 | ORAL_TABLET | ORAL | Status: DC | PRN

## 2018-04-26 MED ORDER — LACTATED RINGERS IV SOLN
INTRAVENOUS | Status: DC
  Administered 2018-04-26: 03:00:00 via INTRAVENOUS

## 2018-04-26 MED ORDER — ONDANSETRON HCL 4 MG PO TABS: 4.0000 mg | ORAL_TABLET | ORAL | Status: DC | PRN

## 2018-04-26 MED ORDER — LIDOCAINE HCL (PF) 1 % IJ SOLN
30.0000 mL | INTRAMUSCULAR | Status: DC | PRN
  Administered 2018-04-26: 30 mL via SUBCUTANEOUS
  Filled 2018-04-26 (×2): qty 30

## 2018-04-26 MED ORDER — PHENYLEPHRINE 40 MCG/ML (10ML) SYRINGE FOR IV PUSH (FOR BLOOD PRESSURE SUPPORT)
PREFILLED_SYRINGE | INTRAVENOUS | Status: AC
  Filled 2018-04-26: qty 10

## 2018-04-26 MED ORDER — LACTATED RINGERS IV SOLN
500.0000 mL | Freq: Once | INTRAVENOUS | Status: AC
  Administered 2018-04-26: 500 mL via INTRAVENOUS

## 2018-04-26 MED ORDER — EPHEDRINE 5 MG/ML INJ
10.0000 mg | INTRAVENOUS | Status: DC | PRN
  Filled 2018-04-26: qty 2

## 2018-04-26 MED ORDER — OXYCODONE-ACETAMINOPHEN 5-325 MG PO TABS: 2.0000 | ORAL_TABLET | ORAL | Status: DC | PRN

## 2018-04-26 MED ORDER — OXYTOCIN BOLUS FROM INFUSION
500.0000 mL | Freq: Once | INTRAVENOUS | Status: AC
  Administered 2018-04-26: 500 mL via INTRAVENOUS

## 2018-04-26 MED ORDER — FLEET ENEMA 7-19 GM/118ML RE ENEM: 1.0000 | ENEMA | RECTAL | Status: DC | PRN

## 2018-04-26 MED ORDER — COCONUT OIL OIL: 1.0000 "application " | TOPICAL_OIL | Status: DC | PRN

## 2018-04-26 MED ORDER — ONDANSETRON HCL 4 MG/2ML IJ SOLN: 4.0000 mg | INTRAMUSCULAR | Status: DC | PRN

## 2018-04-26 MED ORDER — FENTANYL 2.5 MCG/ML BUPIVACAINE 1/10 % EPIDURAL INFUSION (WH - ANES)
14.0000 mL/h | INTRAMUSCULAR | Status: DC | PRN
  Administered 2018-04-26: 14 mL/h via EPIDURAL

## 2018-04-26 MED ORDER — PHENYLEPHRINE 40 MCG/ML (10ML) SYRINGE FOR IV PUSH (FOR BLOOD PRESSURE SUPPORT)
80.0000 ug | PREFILLED_SYRINGE | INTRAVENOUS | Status: DC | PRN
  Filled 2018-04-26: qty 5

## 2018-04-26 MED ORDER — ONDANSETRON HCL 4 MG/2ML IJ SOLN: 4.0000 mg | Freq: Four times a day (QID) | INTRAMUSCULAR | Status: DC | PRN

## 2018-04-26 MED ORDER — ZOLPIDEM TARTRATE 5 MG PO TABS: 5.0000 mg | ORAL_TABLET | Freq: Every evening | ORAL | Status: DC | PRN

## 2018-04-26 MED ORDER — LACTATED RINGERS IV SOLN: 500.0000 mL | INTRAVENOUS | Status: DC | PRN

## 2018-04-26 MED ORDER — LIDOCAINE HCL (PF) 1 % IJ SOLN
INTRAMUSCULAR | Status: DC | PRN
  Administered 2018-04-26: 4 mL via EPIDURAL
  Administered 2018-04-26: 5 mL via EPIDURAL

## 2018-04-26 MED ORDER — DIBUCAINE 1 % RE OINT: 1.0000 "application " | TOPICAL_OINTMENT | RECTAL | Status: DC | PRN

## 2018-04-26 MED ORDER — TETANUS-DIPHTH-ACELL PERTUSSIS 5-2.5-18.5 LF-MCG/0.5 IM SUSP: 0.5000 mL | Freq: Once | INTRAMUSCULAR | Status: DC

## 2018-04-26 MED ORDER — SIMETHICONE 80 MG PO CHEW: 80.0000 mg | CHEWABLE_TABLET | ORAL | Status: DC | PRN

## 2018-04-26 MED ORDER — DIPHENHYDRAMINE HCL 50 MG/ML IJ SOLN: 12.5000 mg | INTRAMUSCULAR | Status: DC | PRN

## 2018-04-26 MED ORDER — SODIUM CHLORIDE 0.9 % IV SOLN
5.0000 10*6.[IU] | Freq: Once | INTRAVENOUS | Status: AC
  Administered 2018-04-26: 5 10*6.[IU] via INTRAVENOUS
  Filled 2018-04-26: qty 5

## 2018-04-26 MED ORDER — OXYTOCIN 40 UNITS IN LACTATED RINGERS INFUSION - SIMPLE MED
2.5000 [IU]/h | INTRAVENOUS | Status: DC
  Administered 2018-04-26: 2.5 [IU]/h via INTRAVENOUS
  Filled 2018-04-26: qty 1000

## 2018-04-26 MED ORDER — WITCH HAZEL-GLYCERIN EX PADS: 1.0000 "application " | MEDICATED_PAD | CUTANEOUS | Status: DC | PRN

## 2018-04-26 MED ORDER — FENTANYL 2.5 MCG/ML BUPIVACAINE 1/10 % EPIDURAL INFUSION (WH - ANES)
INTRAMUSCULAR | Status: AC
  Filled 2018-04-26: qty 100

## 2018-04-26 MED ORDER — SOD CITRATE-CITRIC ACID 500-334 MG/5ML PO SOLN: 30.0000 mL | ORAL | Status: DC | PRN

## 2018-04-26 MED ORDER — PRENATAL MULTIVITAMIN CH
1.0000 | ORAL_TABLET | Freq: Every day | ORAL | Status: DC
  Administered 2018-04-27: 1 via ORAL
  Filled 2018-04-26: qty 1

## 2018-04-26 MED ORDER — PENICILLIN G 3 MILLION UNITS IVPB - SIMPLE MED
3.0000 10*6.[IU] | INTRAVENOUS | Status: DC
  Administered 2018-04-26: 3 10*6.[IU] via INTRAVENOUS
  Filled 2018-04-26: qty 3
  Filled 2018-04-26: qty 100

## 2018-04-26 NOTE — Lactation Note (Signed)
This note was copied from a baby's chart. Lactation Consultation Note: Mother is a P2. She reports that she breastfed and pumped her first child for 2-3 months.  Mother reports that her first child had a high palate and she had a difficult time with latching.  She reports that she continued to pump and her supply went away.   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 She is unsure which pump she wants. She will look at [facility] wedsite and decide.  Mother reports that infant fed once. Staff nurse observed latch and L-S was 7.He is now 4 hours old.   Discussed cue base feeding , advised mother to breastfeed infant 8-12 times in 24 hours.  Discussed cluster feeding .  Mother reports that she is able to hand express drops of colostrum.   Mother advised to page for [facility] or staff nurse to assess the next feeding.  Mother has both sets of Grandparents in room with husband and 69 yr old son.  Mother was given [facility] brochure with information on all [place] services at [facility],  Mother aware of [facility] and out patient dept. Mother given phone line to follow up on discharge for questions and concerns.   Patient Name: [name] Date: 04/26/2018 Reason for consult: Initial assessment   Maternal Data    Feeding    LATCH Score                   Interventions Interventions: Breast feeding basics reviewed;Hand express(mother to page to check latch)  Lactation Tools Discussed/Used     Consult Status Consult Status: Follow-up Date: 04/26/18 Follow-up type: In-patient    Jess Barters [facility] 04/26/2018, 2:40 PM

## 2018-04-26 NOTE — Progress Notes (Signed)
Dr Criss Rosales notified of pt's admission and status. Aware of sve and FHR reassuring as EFM has been on about 27mins. Will see pt and admit to [facility]

## 2018-04-26 NOTE — Anesthesia Procedure Notes (Signed)
Epidural Patient location during procedure: OB Start time: 04/26/2018 3:18 AM End time: 04/26/2018 3:21 AM  Staffing Anesthesiologist: Audry Pili, MD Performed: anesthesiologist   Preanesthetic Checklist Completed: patient identified, pre-op evaluation, timeout performed, IV checked, risks and benefits discussed and monitors and equipment checked  Epidural Patient position: sitting Prep: DuraPrep Patient monitoring: continuous pulse ox and blood pressure Approach: midline Location: L3-L4 Injection technique: LOR saline  Needle:  Needle type: Tuohy  Needle gauge: 17 G Needle length: 9 cm Needle insertion depth: 5 cm Catheter size: 19 Gauge Catheter at skin depth: 10 cm Test dose: negative and Other (1% lidocaine)  Assessment Events: blood not aspirated  Additional Notes Patient identified. Risks including, but not limited to, bleeding, infection, nerve damage, paralysis, inadequate analgesia, blood pressure changes, nausea, vomiting, allergic reaction, postpartum back pain, itching, and headache were discussed. Patient expressed understanding and wished to proceed. Sterile prep and drape, including hand hygiene, mask, and sterile gloves were used. The patient was positioned and the spine was prepped. The skin was anesthetized with lidocaine. No paraesthesia or other complication noted. The patient did not experience any signs of intravascular injection such as tinnitus or metallic taste in mouth, nor signs of intrathecal spread such as rapid motor block. Please see nursing notes for vital signs. The patient tolerated the procedure well.   Rachael [name], MDReason for block:procedure for pain

## 2018-04-26 NOTE — Anesthesia Postprocedure Evaluation (Signed)
Anesthesia Post Note  Patient: Rachael [name]  Procedure(s) Performed: AN AD [place]     Patient location during evaluation: Mother Baby Anesthesia Type: Epidural Level of consciousness: awake and alert and oriented Pain management: satisfactory to patient Vital Signs Assessment: post-procedure vital signs reviewed and stable Respiratory status: respiratory function stable Cardiovascular status: stable Postop Assessment: no headache, no backache, epidural receding, patient able to bend at knees, no signs of nausea or vomiting and adequate PO intake Anesthetic complications: no    Last Vitals:  Vitals:   04/26/18 1215 04/26/18 1315  BP: 122/75 125/76  Pulse: 88 100  Resp: 17 16  Temp: 36.9 C 36.9 C  SpO2: 98% 97%    Last Pain:  Vitals:   04/26/18 1315  TempSrc: Oral  PainSc: 0-No pain   Pain Goal:                 Sherah Lund

## 2018-04-26 NOTE — Anesthesia Pain Management Evaluation Note (Deleted)
  CRNA Pain Management Visit Note  Patient: Rachael [name], 32 y.o., female  "Hello I am a member of the anesthesia team at [facility]. We have an anesthesia team available at all times to provide care throughout the hospital, including epidural management and anesthesia for C-section. I don't know your plan for the delivery whether it a natural birth, water birth, IV sedation, nitrous supplementation, doula or epidural, but we want to meet your pain goals."   1.Was your pain managed to your expectations on prior hospitalizations?   Yes   2.What is your expectation for pain management during this hospitalization?     Epidural  3.How can we help you reach that goal? Support prn  Record the patient's initial score and the patient's pain goal.   Pain: 4  Pain Goal: 6 [facility] wants you to be able to say your pain was always managed very well.  [facility] 04/26/2018

## 2018-04-26 NOTE — H&P (Addendum)
OB/GYN Faculty Practice History and Physical  Rachael [name] is a 32 y.o. female presenting for spontaneous onset of labor.  OB History    Gravida  2   Para  1   Term  1   Preterm      AB      Living  1     SAB      TAB      Ectopic      Multiple      Live Births  1          Past Medical History:  Diagnosis Date  . Allergy   . Asthma   . Family history of breast cancer   . SVD (spontaneous vaginal delivery) 06/26/2014   Past Surgical History:  Procedure Laterality Date  . WISDOM TOOTH EXTRACTION     Family History: family history includes Breast cancer in her maternal grandmother, other, and paternal grandmother; Cancer in her maternal grandmother and paternal grandmother; Diabetes in her maternal grandfather, maternal grandmother, paternal grandfather, paternal grandmother, and unknown relative; Ovarian cancer in her mother. Social History:  reports that she has never smoked. She has never used smokeless tobacco. She reports that she drinks alcohol. She reports that she does not use drugs.     Maternal Diabetes: No Genetic Screening: Normal Maternal Ultrasounds/Referrals: Normal Fetal Ultrasounds or other Referrals:  None Maternal Substance Abuse:  No Significant Maternal Medications:  None Significant Maternal Lab Results:  Lab values include: Group B Strep positive Other Comments:  None  Review of Systems  Respiratory: Negative for shortness of breath.   Cardiovascular: Negative for chest pain and leg swelling.  Genitourinary: Negative for dysuria and hematuria.  Musculoskeletal: Negative for falls.  Neurological: Negative for dizziness.   History Dilation: 5.5 Effacement (%): 100 Station: -2 Exam by:: K. WeissRN Height 5\' 6"  (1.676 m), weight 97.1 kg, last menstrual period 07/21/2017.  Vertex by sutures Exam Physical Exam  Constitutional: She appears well-developed and well-nourished. No distress.  HENT:  Head: Normocephalic and  atraumatic.  Eyes: Conjunctivae are normal. No scleral icterus.  Cardiovascular: Normal rate and regular rhythm.  Respiratory: Effort normal.  GI: Soft. There is no tenderness. There is no rebound and no guarding.  Musculoskeletal: Normal range of motion. She exhibits no deformity.  Neurological: She is alert. Coordination normal.  Skin: Skin is warm and dry. She is not diaphoretic.    Prenatal labs: ABO, Rh: A/RH(D) POSITIVE/-- (01/16 1400) Antibody: NO ANTIBODIES DETECTED (01/16 1400) Rubella: 1.03 (01/16 1400) RPR: NON-REACTIVE (06/05 0830)  HBsAg: NON-REACTIVE (01/16 1400)  HIV: NON-REACTIVE (06/05 0830)  GBS:   pos  Assessment/Plan: 32yo G2P1001 at [redacted]w[redacted]d who presented with spontaneous onset of labor.   GBS pos, will treat with penicillin Patient requests epidural  Boy(?)/breast/POP  Sherene Sires, DO 04/26/2018, 2:55 AM  Attestation: I agree with the resident's documentation. I have seen and separately examined the patient. EWF 7-8lbs by Leopold's. Comfortable with epidural.  Quanetta Truss S. Juleen China, DO OB/GYN Fellow, Faculty Practice

## 2018-04-26 NOTE — Anesthesia Preprocedure Evaluation (Signed)
Anesthesia Evaluation  Patient identified by MRN, date of birth, ID band Patient awake    Reviewed: Allergy & Precautions, NPO status , Patient's Chart, lab work & pertinent test results  History of Anesthesia Complications Negative for: history of anesthetic complications  Airway Mallampati: II  TM Distance: >3 FB Neck ROM: Full    Dental  (+) Dental Advisory Given   Pulmonary asthma ,    breath sounds clear to auscultation       Cardiovascular negative cardio ROS   Rhythm:Regular Rate:Normal     Neuro/Psych negative neurological ROS  negative psych ROS   GI/Hepatic negative GI ROS, Neg liver ROS,   Endo/Other   Obesity   Renal/GU negative Renal ROS  negative genitourinary   Musculoskeletal negative musculoskeletal ROS (+)   Abdominal   Peds  Hematology negative hematology ROS (+)   Anesthesia Other Findings   Reproductive/Obstetrics (+) Pregnancy                             Anesthesia Physical Anesthesia Plan  ASA: II  Anesthesia Plan: Epidural   Post-op Pain Management:    Induction:   PONV Risk Score and Plan: 2 and Treatment may vary due to age or medical condition  Airway Management Planned: Natural Airway  Additional Equipment: None  Intra-op Plan:   Post-operative Plan:   Informed Consent: I have reviewed the patients History and Physical, chart, labs and discussed the procedure including the risks, benefits and alternatives for the proposed anesthesia with the patient or authorized representative who has indicated his/her understanding and acceptance.     Plan Discussed with: Anesthesiologist  Anesthesia Plan Comments: (Labs reviewed. Platelets acceptable, patient not taking any blood thinning medications. Per RN, FHR tracing reported to be stable enough for sitting procedure. Risks and benefits discussed with patient, including PDPH, backache, epidural  hematoma, failed epidural, allergic reaction, and nerve injury. Patient expressed understanding and wished to proceed.)        Anesthesia Quick Evaluation

## 2018-04-27 NOTE — Discharge Summary (Addendum)
OB Discharge Summary     Patient Name: Rachael [name] DOB: April 26, 1986 MRN: [id number]  Date of admission: 04/26/2018 Delivering MD: Matilde Haymaker   Date of discharge: 04/27/2018  Admitting diagnosis: 40 wks cxt 4 mins apart Intrauterine pregnancy: 104w6d     Secondary diagnosis:  Active Problems:   Normal labor   SVD (spontaneous vaginal delivery)  Additional problems: none     Discharge diagnosis: Term Pregnancy Delivered                                                                                                Post partum procedures:none  Augmentation: none  Complications: None  Hospital course:  Onset of Labor With Vaginal Delivery     32 y.o. yo [id number] at [redacted]w[redacted]d was admitted in Active Labor on 04/26/2018. Patient had an uncomplicated labor course as follows:  Membrane Rupture Time/Date: 6:10 AM ,04/26/2018   Intrapartum Procedures: Episiotomy: None [1]                                         Lacerations:  2nd degree [3];Periurethral [8]  Patient had a delivery of a Viable infant. 04/26/2018  Information for the patient's newborn:  Adelyne, Marchese [[id number]]  Delivery Method: Vaginal, Spontaneous(Filed from Delivery Summary)    Pateint had an uncomplicated postpartum course.  She is ambulating, tolerating a regular diet, passing flatus, and urinating well. Patient is discharged home in stable condition on 04/27/18.   Physical exam  Vitals:   04/26/18 1715 04/26/18 2157 04/27/18 0212 04/27/18 0619  BP: 117/76 117/62 120/68 100/61  Pulse: 85 92 89 84  Resp: 17 16 18 18   Temp: 98.2 F (36.8 C) 98.1 F (36.7 C)  98.2 F (36.8 C)  TempSrc: Oral Oral Oral Oral  SpO2: 98% 97% 98% 97%  Weight:      Height:       General: alert, cooperative and no distress Lochia: appropriate Uterine Fundus: firm Incision: Healing well with no significant drainage DVT Evaluation: No evidence of DVT seen on physical exam. Labs: Lab Results  Component Value Date   WBC 13.0 (H)  04/26/2018   HGB 12.5 04/26/2018   HCT 37.5 04/26/2018   MCV 91.2 04/26/2018   PLT 223 04/26/2018   CMP Latest Ref Rng & Units 01/10/2018  Glucose 65 - 99 mg/dL 76  BUN 7 - 25 mg/dL 8  Creatinine 0.50 - 1.10 mg/dL 0.49(L)  Sodium 135 - 146 mmol/L 136  Potassium 3.5 - 5.3 mmol/L 3.9  Chloride 98 - 110 mmol/L 103  CO2 20 - 32 mmol/L 24  Calcium 8.6 - 10.2 mg/dL 9.4  Total Protein 6.1 - 8.1 g/dL 6.7  Total Bilirubin 0.2 - 1.2 mg/dL 0.4  Alkaline Phos 33 - 115 U/L -  AST 10 - 30 U/L 10  ALT 6 - 29 U/L 9    Discharge instruction: per After Visit Summary and "Baby and Me Booklet".  After visit meds:  Allergies as of 04/27/2018  No Known Allergies     Medication List    TAKE these medications   acetaminophen 325 MG tablet Commonly known as:  TYLENOL Take 650 mg by mouth every 6 (six) hours as needed for headache.   cetirizine 10 MG tablet Commonly known as:  ZYRTEC Take 10 mg by mouth daily.   PRENATA PO Take by mouth.       Diet: routine diet  Activity: Advance as tolerated. Pelvic rest for 6 weeks.   Outpatient follow up:4 weeks Follow up Appt:No future appointments. Follow up Visit:No follow-ups on file.  Postpartum contraception: Progesterone only pills  Newborn Data: Live born female  Birth Weight: 7 lb 9.5 oz (3445 g) APGAR: 9, 9  Newborn Delivery   Birth date/time:  04/26/2018 09:47:00 Delivery type:  Vaginal, Spontaneous     Baby Feeding: Breast Disposition:home with mother   04/27/2018 Matilde Haymaker, MD   I examined this patient and agree with the above assessment

## 2018-04-27 NOTE — Progress Notes (Signed)
Post Partum Day 1 Subjective: no complaints, up ad lib, voiding and tolerating PO  Objective: Blood pressure 100/61, pulse 84, temperature 98.2 F (36.8 C), temperature source Oral, resp. rate 18, height 5\' 6"  (1.676 m), weight 97.1 kg, last menstrual period 07/21/2017, SpO2 97 %, unknown if currently breastfeeding.  Physical Exam:  General: alert, cooperative, appears stated age and no distress Lochia: appropriate Uterine Fundus: firm Incision: n/a DVT Evaluation: No evidence of DVT seen on physical exam.  Recent Labs    04/26/18 0230  HGB 12.5  HCT 37.5    Assessment/Plan: Plan for discharge tomorrow   LOS: 1 day   Koren Shiver 04/27/2018, 9:23 AM

## 2018-04-27 NOTE — Lactation Note (Signed)
This note was copied from a baby's chart. Lactation Consultation Note  Patient Name: Rachael [name] Date: 04/27/2018 Reason for consult: Follow-up assessment;Term Mom reports that baby is latching well.  Instructed to feed with early feeding cues.  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  Lactation services and support information reviewed and encouraged prn.  Maternal Data    Feeding Feeding Type: Breast Fed  LATCH Score Latch: Repeated attempts needed to sustain latch, nipple held in mouth throughout feeding, stimulation needed to elicit sucking reflex.  Audible Swallowing: A few with stimulation  Type of Nipple: Everted at rest and after stimulation  Comfort (Breast/Nipple): Soft / non-tender  Hold (Positioning): Assistance needed to correctly position infant at breast and maintain latch.  LATCH Score: 7  Interventions    Lactation Tools Discussed/Used     Consult Status Consult Status: Complete Follow-up type: Call as needed    Ave Filter 04/27/2018, 10:35 AM

## 2018-04-27 NOTE — Discharge Instructions (Signed)
Vaginal Delivery, Care After °Refer to this sheet in the next few weeks. These instructions provide you with information about caring for yourself after vaginal delivery. Your health care provider may also give you more specific instructions. Your treatment has been planned according to current medical practices, but problems sometimes occur. Call your health care provider if you have any problems or questions. °What can I expect after the procedure? °After vaginal delivery, it is common to have: °· Some bleeding from your vagina. °· Soreness in your abdomen, your vagina, and the area of skin between your vaginal opening and your anus (perineum). °· Pelvic cramps. °· Fatigue. ° °Follow these instructions at home: °Medicines °· Take over-the-counter and prescription medicines only as told by your health care provider. °· If you were prescribed an antibiotic medicine, take it as told by your health care provider. Do not stop taking the antibiotic until it is finished. °Driving ° °· Do not drive or operate heavy machinery while taking prescription pain medicine. °· Do not drive for 24 hours if you received a sedative. °Lifestyle °· Do not drink alcohol. This is especially important if you are breastfeeding or taking medicine to relieve pain. °· Do not use tobacco products, including cigarettes, chewing tobacco, or e-cigarettes. If you need help quitting, ask your health care provider. °Eating and drinking °· Drink at least 8 eight-ounce glasses of water every day unless you are told not to by your health care provider. If you choose to breastfeed your baby, you may need to drink more water than this. °· Eat high-fiber foods every day. These foods may help prevent or relieve constipation. High-fiber foods include: °? Whole grain cereals and breads. °? Brown rice. °? Beans. °? Fresh fruits and vegetables. °Activity °· Return to your normal activities as told by your health care provider. Ask your health care provider  what activities are safe for you. °· Rest as much as possible. Try to rest or take a nap when your baby is sleeping. °· Do not lift anything that is heavier than your baby or 10 lb (4.5 kg) until your health care provider says that it is safe. °· Talk with your health care provider about when you can engage in sexual activity. This may depend on your: °? Risk of infection. °? Rate of healing. °? Comfort and desire to engage in sexual activity. °Vaginal Care °· If you have an episiotomy or a vaginal tear, check the area every day for signs of infection. Check for: °? More redness, swelling, or pain. °? More fluid or blood. °? Warmth. °? Pus or a bad smell. °· Do not use tampons or douches until your health care provider says this is safe. °· Watch for any blood clots that may pass from your vagina. These may look like clumps of dark red, brown, or black discharge. °General instructions °· Keep your perineum clean and dry as told by your health care provider. °· Wear loose, comfortable clothing. °· Wipe from front to back when you use the toilet. °· Ask your health care provider if you can shower or take a bath. If you had an episiotomy or a perineal tear during labor and delivery, your health care provider may tell you not to take baths for a certain length of time. °· Wear a bra that supports your breasts and fits you well. °· If possible, have someone help you with household activities and help care for your baby for at least a few days after   you leave the hospital. °· Keep all follow-up visits for you and your baby as told by your health care provider. This is important. °Contact a health care provider if: °· You have: °? Vaginal discharge that has a bad smell. °? Difficulty urinating. °? Pain when urinating. °? A sudden increase or decrease in the frequency of your bowel movements. °? More redness, swelling, or pain around your episiotomy or vaginal tear. °? More fluid or blood coming from your episiotomy or  vaginal tear. °? Pus or a bad smell coming from your episiotomy or vaginal tear. °? A fever. °? A rash. °? Little or no interest in activities you used to enjoy. °? Questions about caring for yourself or your baby. °· Your episiotomy or vaginal tear feels warm to the touch. °· Your episiotomy or vaginal tear is separating or does not appear to be healing. °· Your breasts are painful, hard, or turn red. °· You feel unusually sad or worried. °· You feel nauseous or you vomit. °· You pass large blood clots from your vagina. If you pass a blood clot from your vagina, save it to show to your health care provider. Do not flush blood clots down the toilet without having your health care provider look at them. °· You urinate more than usual. °· You are dizzy or light-headed. °· You have not breastfed at all and you have not had a menstrual period for 12 weeks after delivery. °· You have stopped breastfeeding and you have not had a menstrual period for 12 weeks after you stopped breastfeeding. °Get help right away if: °· You have: °? Pain that does not go away or does not get better with medicine. °? Chest pain. °? Difficulty breathing. °? Blurred vision or spots in your vision. °? Thoughts about hurting yourself or your baby. °· You develop pain in your abdomen or in one of your legs. °· You develop a severe headache. °· You faint. °· You bleed from your vagina so much that you fill two sanitary pads in one hour. °This information is not intended to replace advice given to you by your health care provider. Make sure you discuss any questions you have with your health care provider. °Document Released: 08/17/2000 Document Revised: 02/01/2016 Document Reviewed: 09/04/2015 °Elsevier Interactive Patient Education © 2018 [employer] ° °

## 2018-05-06 ENCOUNTER — Telehealth: Payer: Self-pay

## 2018-05-06 NOTE — Telephone Encounter (Signed)
Pt who recently delivered called stating that she was having lower right abdominal pain. I offered pt an appt with Dr.Leggett this afternoon but, she declined and states she will go be seen by urgent care. I also gave pt the option to go be seen at [facility].

## 2018-05-22 NOTE — Progress Notes (Signed)
Post Partum Exam  Rachael [name] is a 32 y.o. G28P2002 female who presents for a postpartum visit. She is 4 weeks postpartum following a spontaneous vaginal delivery. I have fully reviewed the prenatal and intrapartum course. The delivery was at 90 gestational weeks and 6 days.  Anesthesia: epidural. Postpartum course has been unremarkable. Baby's course has been unremarkable. Baby is feeding by both breast and bottle - Similac Sensitive RS. Bleeding no bleeding. Bowel function is normal. Bladder function is normal. Patient is not sexually active. Contraception method is oral progesterone-only contraceptive. Postpartum depression screening:neg  The following portions of the patient's history were reviewed and updated as appropriate: allergies, current medications, past family history, past medical history, past social history, past surgical history and problem list.  Review of Systems Pertinent items are noted in HPI.    Objective:    BP 116/78 mmHg  Pulse 78  Resp 16  Ht 5\' 5"  (1.651 m)  Wt 211 lb (95.709 kg)  BMI 35.11 kg/m2  Breastfeeding? Yes  General:  alert   Breasts:  inspection negative, no nipple discharge or bleeding, no masses or nodularity palpable  Lungs: clear to auscultation bilaterally  Heart:  regular rate and rhythm, S1, S2 normal, no murmur, click, rub or gallop  Abdomen: soft, non-tender; bowel sounds normal; no masses,  no organomegaly   Vulva:  normal  Vagina: normal vagina  Cervix:  anteverted  Corpus: normal  Adnexa:  normal adnexa  Rectal Exam: Not performed.        Assessment:    Normal postpartum exam. Pap smear not done at today's visit.   Plan:   1. Contraception: POPs 2.pap due in 2 more years 3. Follow up in: 2 years or as needed.  4. Change to combination OCPs when finished with breastfeeding

## 2018-05-26 ENCOUNTER — Ambulatory Visit (INDEPENDENT_AMBULATORY_CARE_PROVIDER_SITE_OTHER): Payer: No Typology Code available for payment source | Admitting: Obstetrics & Gynecology

## 2018-05-26 ENCOUNTER — Encounter: Payer: Self-pay | Admitting: Obstetrics & Gynecology

## 2018-05-26 DIAGNOSIS — Z1389 Encounter for screening for other disorder: Secondary | ICD-10-CM | POA: Diagnosis not present

## 2018-05-26 DIAGNOSIS — IMO0001 Reserved for inherently not codable concepts without codable children: Secondary | ICD-10-CM

## 2018-05-26 DIAGNOSIS — Z789 Other specified health status: Secondary | ICD-10-CM

## 2018-05-26 NOTE — Progress Notes (Signed)
PT will get flu shot through work

## 2018-05-29 ENCOUNTER — Telehealth: Payer: Self-pay

## 2018-05-29 MED ORDER — NORGESTREL-ETHINYL ESTRADIOL 0.3-30 MG-MCG PO TABS: 1.0000 | ORAL_TABLET | Freq: Every day | ORAL | 4 refills | Status: DC

## 2018-05-29 MED FILL — ELINEST-28 TABLET: 0.3-30 | 84 days supply | Qty: 84 | Fill #0

## 2018-05-29 NOTE — Telephone Encounter (Signed)
error 

## 2018-05-29 NOTE — Addendum Note (Signed)
Addended by: Lyndal Rainbow on: 05/29/2018 02:18 PM   Modules accepted: Orders

## 2018-08-08 ENCOUNTER — Telehealth: Payer: Self-pay | Admitting: *Deleted

## 2018-08-08 MED ORDER — NORGESTREL-ETHINYL ESTRADIOL 0.3-30 MG-MCG PO TABS: ORAL_TABLET | ORAL | 4 refills | Status: DC

## 2018-08-08 MED FILL — ELINEST-28 TABLET: 0.3-30 | 84 days supply | Qty: 84 | Fill #0

## 2018-08-08 NOTE — Telephone Encounter (Signed)
Pt called because she has always taken her OCP's for 3 months then cycled and then restarted OCP's  Her las tRX was written differently and she has run short.  New RX written and sent to [facility]

## 2018-09-01 ENCOUNTER — Encounter: Payer: Self-pay | Admitting: Physician Assistant

## 2018-09-01 ENCOUNTER — Ambulatory Visit (INDEPENDENT_AMBULATORY_CARE_PROVIDER_SITE_OTHER): Payer: No Typology Code available for payment source | Admitting: Physician Assistant

## 2018-09-01 VITALS — BP 126/79 | HR 85 | Temp 97.3°F | Ht 66.0 in | Wt 184.0 lb

## 2018-09-01 DIAGNOSIS — J069 Acute upper respiratory infection, unspecified: Secondary | ICD-10-CM

## 2018-09-01 DIAGNOSIS — J028 Acute pharyngitis due to other specified organisms: Secondary | ICD-10-CM | POA: Diagnosis not present

## 2018-09-01 MED ORDER — IPRATROPIUM BROMIDE 0.06 % NA SOLN: 2.0000 | Freq: Four times a day (QID) | NASAL | 1 refills | Status: DC

## 2018-09-01 MED ORDER — METHYLPREDNISOLONE SODIUM SUCC 125 MG IJ SOLR
125.0000 mg | Freq: Once | INTRAMUSCULAR | Status: AC
  Administered 2018-09-01: 125 mg via INTRAMUSCULAR

## 2018-09-01 NOTE — Progress Notes (Signed)
Subjective:    Patient ID: Rachael [name], female    DOB: Nov 29, 1985, 32 y.o.   MRN: [id number]  HPI  Pt is a 32 yo female who presents to the clinic with 3 days of ST and PND. She has a cough but dry. Her worst symptoms is her PND and ST. She is taking tylenol cold and sinus. Denies any SOB, wheezing, fever, chills. Her 3 month old son was dx with RSV yesterday. Denies any ear pain or sinus pressure.   .. Active Ambulatory Problems    Diagnosis Date Noted  . Allergy-induced asthma 06/30/2009  . Eustachian tube dysfunction 06/30/2014  . Family history of breast cancer   . Genetic testing 02/22/2015  . Family hx of ovarian malignancy 04/03/2016  . Overweight (BMI 25.0-29.9) 04/03/2016  . Supervision of other normal pregnancy, antepartum 09/18/2017  . Pyelectasis of fetus on prenatal ultrasound 12/05/2017  . Skin mole 01/12/2018  . Suspicious nevus 01/12/2018  . Normal labor 04/26/2018  . SVD (spontaneous vaginal delivery) 04/26/2018   Resolved Ambulatory Problems    Diagnosis Date Noted  . Allergic rhinitis 10/11/2011  . Normal first pregnancy confirmed, currently in third trimester 06/25/2014  . SVD (spontaneous vaginal delivery) 06/26/2014  . Pain in left ear 06/30/2014   Past Medical History:  Diagnosis Date  . Allergy   . Asthma       Review of Systems See HPI.     Objective:   Physical Exam Vitals signs reviewed.  Constitutional:      Appearance: She is well-developed.  HENT:     Head: Normocephalic and atraumatic.     Right Ear: Tympanic membrane and ear canal normal.     Left Ear: Tympanic membrane and ear canal normal.     Nose: Congestion and rhinorrhea present.     Comments: No tenderness over maxillary sinuses to palpation.     Mouth/Throat:     Mouth: Mucous membranes are moist.     Pharynx: Uvula midline. Posterior oropharyngeal erythema present. No pharyngeal swelling, oropharyngeal exudate or uvula swelling.     Tonsils: No tonsillar exudate or  tonsillar abscesses.  Eyes:     Conjunctiva/sclera: Conjunctivae normal.  Neck:     Musculoskeletal: Normal range of motion and neck supple.  Cardiovascular:     Rate and Rhythm: Normal rate and regular rhythm.     Heart sounds: No murmur.  Pulmonary:     Effort: Pulmonary effort is normal.     Breath sounds: Normal breath sounds.  Neurological:     General: No focal deficit present.     Mental Status: She is alert and oriented to person, place, and time.  Psychiatric:        Mood and Affect: Mood normal.        Behavior: Behavior normal.           Assessment & Plan:  Marland KitchenMarland KitchenTiarrah was seen today for sore throat.  Diagnoses and all orders for this visit:  Acute pharyngitis due to other specified organisms -     methylPREDNISolone sodium succinate (SOLU-MEDROL) 125 mg/2 mL injection 125 mg  Viral upper respiratory tract infection -     ipratropium (ATROVENT) 0.06 % nasal spray; Place 2 sprays into both nostrils 4 (four) times daily.  vitals reassuring. I suspect viral acute pharyngitis. Discussed symptomatic care with salt water gargles, tylenol cold sinus severe. Added atrovent. Rest and hydrate. Follow up with worsening symptoms or as needed. Pt concerned with ST. Will give  solumedrol shot to help with ST symptoms.

## 2018-09-01 NOTE — Patient Instructions (Addendum)
atrovent nasal spray.  humidifer Continue advil cold and sinus Cough drops   Pharyngitis  Pharyngitis is a sore throat (pharynx). This is when there is redness, pain, and swelling in your throat. Most of the time, this condition gets better on its own. In some cases, you may need medicine. Follow these instructions at home:  Take over-the-counter and prescription medicines only as told by your doctor. ? If you were prescribed an antibiotic medicine, take it as told by your doctor. Do not stop taking the antibiotic even if you start to feel better. ? Do not give children aspirin. Aspirin has been linked to Reye syndrome.  Drink enough water and fluids to keep your pee (urine) clear or pale yellow.  Get a lot of rest.  Rinse your mouth (gargle) with a salt-water mixture 3-4 times a day or as needed. To make a salt-water mixture, completely dissolve -1 tsp of salt in 1 cup of warm water.  If your doctor approves, you may use throat lozenges or sprays to soothe your throat. Contact a doctor if:  You have large, tender lumps in your neck.  You have a rash.  You cough up green, yellow-brown, or bloody spit. Get help right away if:  You have a stiff neck.  You drool or cannot swallow liquids.  You cannot drink or take medicines without throwing up.  You have very bad pain that does not go away with medicine.  You have problems breathing, and it is not from a stuffy nose.  You have new pain and swelling in your knees, ankles, wrists, or elbows. Summary  Pharyngitis is a sore throat (pharynx). This is when there is redness, pain, and swelling in your throat.  If you were prescribed an antibiotic medicine, take it as told by your doctor. Do not stop taking the antibiotic even if you start to feel better.  Most of the time, pharyngitis gets better on its own. Sometimes, you may need medicine. This information is not intended to replace advice given to you by your health care  provider. Make sure you discuss any questions you have with your health care provider. Document Released: 02/06/2008 Document Revised: 09/25/2016 Document Reviewed: 09/25/2016 Elsevier Interactive Patient Education  2019 [employer].

## 2018-09-04 ENCOUNTER — Encounter: Payer: Self-pay | Admitting: Physician Assistant

## 2018-09-04 MED ORDER — AMOXICILLIN-POT CLAVULANATE 875-125 MG PO TABS: 1.0000 | ORAL_TABLET | Freq: Two times a day (BID) | ORAL | 0 refills | Status: DC

## 2018-09-04 MED FILL — AMOX-CLAV 875-125 MG TABLET: 875-125 | 10 days supply | Qty: 20 | Fill #0

## 2018-09-18 ENCOUNTER — Ambulatory Visit (INDEPENDENT_AMBULATORY_CARE_PROVIDER_SITE_OTHER): Payer: No Typology Code available for payment source | Admitting: Osteopathic Medicine

## 2018-09-18 ENCOUNTER — Encounter: Payer: Self-pay | Admitting: Osteopathic Medicine

## 2018-09-18 DIAGNOSIS — B9789 Other viral agents as the cause of diseases classified elsewhere: Secondary | ICD-10-CM

## 2018-09-18 DIAGNOSIS — J069 Acute upper respiratory infection, unspecified: Secondary | ICD-10-CM

## 2018-09-18 DIAGNOSIS — J029 Acute pharyngitis, unspecified: Secondary | ICD-10-CM | POA: Diagnosis not present

## 2018-09-18 DIAGNOSIS — J028 Acute pharyngitis due to other specified organisms: Secondary | ICD-10-CM

## 2018-09-18 LAB — POCT RAPID STREP A (OFFICE): RAPID STREP A SCREEN: NEGATIVE

## 2018-09-18 MED ORDER — METHYLPREDNISOLONE SODIUM SUCC 125 MG IJ SOLR
125.0000 mg | Freq: Once | INTRAMUSCULAR | Status: AC
  Administered 2018-09-18: 125 mg via INTRAMUSCULAR

## 2018-09-18 NOTE — Progress Notes (Signed)
HPI: Rachael [name] is a 33 y.o. female who  has a past medical history of Allergy, Asthma, Family history of breast cancer, and SVD (spontaneous vaginal delivery) (06/26/2014).  she presents to [facility] today, 09/18/18,  for chief complaint of: Sore throat and ear pain  . Location, Quality: sore throat and ear pressure . Duration: 1 day . Modifying factors: previous similar symptoms was given steroids which helped some, needed abx fr ear pain.  . Assoc signs/symptoms: cough, fever, sinus congestion      Past medical, surgical, social and family history reviewed and updated as necessary.   Current medication list and allergy/intolerance information reviewed:    Current Outpatient Medications  Medication Sig Dispense Refill  . acetaminophen (TYLENOL) 325 MG tablet Take 650 mg by mouth every 6 (six) hours as needed for headache.    . cetirizine (ZYRTEC) 10 MG tablet Take 10 mg by mouth daily.    [name] ipratropium (ATROVENT) 0.06 % nasal spray Place 2 sprays into both nostrils 4 (four) times daily. 15 mL 1  . norgestrel-ethinyl estradiol (LO/OVRAL,CRYSELLE) 0.3-30 MG-MCG tablet Take 1 pill daily continuously for 12 weeks then stop for a week then resume again for 3 months. 3 Package 4  . Prenatal w/o A Vit-Fe Fum-FA (PRENATA PO) Take by mouth.    [name] amoxicillin-clavulanate (AUGMENTIN) 875-125 MG tablet Take 1 tablet by mouth 2 (two) times daily. (Patient not taking: Reported on 09/18/2018) 20 tablet 0   No current facility-administered medications for this visit.     No Known Allergies    Review of Systems:  Constitutional:  +fever, no chills, +recent illness, No unintentional weight changes. +significant fatigue.   HEENT: +headache, no vision change, no hearing change, +sore throat, +sinus pressure  Cardiac: No  chest pain, No  pressure, No palpitations  Respiratory:  No  shortness of breath. +Cough  Gastrointestinal: No  abdominal pain, No   nausea, No  vomiting,  No  blood in stool, No  diarrhea  Musculoskeletal: No new myalgia/arthralgia  Skin: No  Rash  Neurologic: No  weakness, No  dizziness  Exam:  BP 118/78 (BP Location: Left Arm, Patient Position: Sitting, Cuff Size: Normal)   Pulse 80   Temp 98 F (36.7 C) (Oral)   Wt 184 lb 1.6 oz (83.5 kg)   BMI 29.71 kg/m   Constitutional: VS see above. General Appearance: alert, well-developed, well-nourished, NAD  Eyes: Normal lids and conjunctive, non-icteric sclera  Ears, Nose, Mouth, Throat: MMM, Normal external inspection ears/nares/mouth/lips/gums. TM normal bilaterally w/ bilateral clear effusion. Pharynx/tonsils +erythema, no exudate. Nasal mucosa normal.   Neck: No masses, trachea midline. No tenderness/mass appreciated. No lymphnode enlargement, some tenderness L anterior cervical LN  Respiratory: Normal respiratory effort. no wheeze, no rhonchi, no rales  Cardiovascular: S1/S2 normal, no murmur, no rub/gallop auscultated. RRR. No lower extremity edema.   Musculoskeletal: Gait normal.   Neurological: Normal balance/coordination. No tremor.   Skin: warm, dry, intact.   Psychiatric: Normal judgment/insight. Normal mood and affect.   Results for orders placed or performed in visit on 09/18/18 (from the past 72 hour(s))  POCT rapid strep A     Status: None   Collection Time: 09/18/18  8:32 AM  Result Value Ref Range   Rapid Strep A Screen Negative Negative     ASSESSMENT/PLAN: Diagnoses of Sore throat, Viral URI with cough, and Acute viral pharyngitis were pertinent to this visit.   Steroid shot in office today  OK  to continue home Atrovent or can use Afrin  See below for OTC meds  Call if not better in 7-10 days, or sooner if change/worse    Meds ordered this encounter  Medications  . methylPREDNISolone sodium succinate (SOLU-MEDROL) 125 mg/2 mL injection 125 mg    Orders Placed This Encounter  Procedures  . Culture, Group A Strep  .  POCT rapid strep A    Patient Instructions  Medications & Home Remedies for Upper Respiratory Illness    Aches/Pains, Fever, Headache OTC Acetaminophen (Tylenol) 500 mg tablets - take max 2 tablets (1000 mg) every 6 hours (4 times per day)  OTC Ibuprofen (Motrin) 200 mg tablets - take max 4 tablets (800 mg) every 6 hours   Sinus Congestion Prescription Atrovent as directed OTC Nasal Saline if desired to rinse OTC Oxymetolazone (Afrin, others) sparing use due to rebound congestion, NEVER use in kids OTC Phenylephrine (Sudafed) 10 mg tablets every 4 hours (or the 12-hour formulation) OTC Diphenhydramine (Benadryl) 25 mg tablets - take max 2 tablets every 4 hours   Cough & Sore Throat OTC Dextromethorphan (Robitussin, others) - cough suppressant OTC Guaifenesin (Robitussin, Mucinex, others) - expectorant (helps cough up mucus) (Dextromethorphan and Guaifenesin also come in a combination tablet/syrup) OTC Lozenges w/ Benzocaine + Menthol (Cepacol) Honey - as much as you want! Teas which "coat the throat" - look for ingredients Elm Bark, Licorice Root, Marshmallow Root   Other Prescription Steroids to decrease inflammation and improve energy Prescription Antibiotics if these are necessary for bacterial infection - take ALL, even if you're feeling better   OTC Zinc Lozenges within 24 hours of symptoms onset - mixed evidence this shortens the duration of the common cold Don't waste your money on Vitamin C or Echinacea in acute illness - it's already too late!          Visit summary with medication list and pertinent instructions was printed for patient to review. All questions at time of visit were answered - patient instructed to contact office with any additional concerns or updates. ER/RTC precautions were reviewed with the patient.   Follow-up plan: Return if symptoms worsen or fail to improve.    Please note: voice recognition software was used to produce this document, and  typos may escape review. Please contact Dr. Sheppard Coil for any needed clarifications.

## 2018-09-18 NOTE — Patient Instructions (Signed)
Medications & Home Remedies for Upper Respiratory Illness    Aches/Pains, Fever, Headache OTC Acetaminophen (Tylenol) 500 mg tablets - take max 2 tablets (1000 mg) every 6 hours (4 times per day)  OTC Ibuprofen (Motrin) 200 mg tablets - take max 4 tablets (800 mg) every 6 hours   Sinus Congestion Prescription Atrovent as directed OTC Nasal Saline if desired to rinse OTC Oxymetolazone (Afrin, others) sparing use due to rebound congestion, NEVER use in kids OTC Phenylephrine (Sudafed) 10 mg tablets every 4 hours (or the 12-hour formulation) OTC Diphenhydramine (Benadryl) 25 mg tablets - take max 2 tablets every 4 hours   Cough & Sore Throat OTC Dextromethorphan (Robitussin, others) - cough suppressant OTC Guaifenesin (Robitussin, Mucinex, others) - expectorant (helps cough up mucus) (Dextromethorphan and Guaifenesin also come in a combination tablet/syrup) OTC Lozenges w/ Benzocaine + Menthol (Cepacol) Honey - as much as you want! Teas which "coat the throat" - look for ingredients Elm Bark, Licorice Root, Marshmallow Root   Other Prescription Steroids to decrease inflammation and improve energy Prescription Antibiotics if these are necessary for bacterial infection - take ALL, even if you're feeling better   OTC Zinc Lozenges within 24 hours of symptoms onset - mixed evidence this shortens the duration of the common cold Don't waste your money on Vitamin C or Echinacea in acute illness - it's already too late!

## 2018-09-20 LAB — CULTURE, GROUP A STREP
MICRO NUMBER:: 65781
SPECIMEN QUALITY:: ADEQUATE

## 2018-09-29 ENCOUNTER — Encounter: Payer: Self-pay | Admitting: Physician Assistant

## 2018-10-03 ENCOUNTER — Encounter: Payer: Self-pay | Admitting: Physician Assistant

## 2018-10-03 ENCOUNTER — Ambulatory Visit (INDEPENDENT_AMBULATORY_CARE_PROVIDER_SITE_OTHER): Payer: No Typology Code available for payment source | Admitting: Physician Assistant

## 2018-10-03 VITALS — BP 117/59 | HR 83 | Resp 16 | Ht 66.0 in | Wt 187.0 lb

## 2018-10-03 DIAGNOSIS — Z6830 Body mass index (BMI) 30.0-30.9, adult: Secondary | ICD-10-CM

## 2018-10-03 DIAGNOSIS — E6609 Other obesity due to excess calories: Secondary | ICD-10-CM

## 2018-10-03 MED ORDER — TOPIRAMATE 25 MG PO TABS: 25.0000 mg | ORAL_TABLET | Freq: Two times a day (BID) | ORAL | 0 refills | Status: DC

## 2018-10-03 MED ORDER — PHENTERMINE HCL 15 MG PO CAPS: 15.0000 mg | ORAL_CAPSULE | ORAL | 0 refills | Status: DC

## 2018-10-03 NOTE — Progress Notes (Signed)
   Subjective:    Patient ID: Rachael [name], female    DOB: 1986/04/02, 33 y.o.   MRN: [id number]  HPI  Pt is a 33 yo female who presents to the clinic to discuss weight. She has a family history of MI, stroke, DM who is struggling with her weight and wants to lower her risk of developing these conditions. She used phentermine in the past and helped lose weight but also increased her heart rate. She admits she is not exercising.   .. Active Ambulatory Problems    Diagnosis Date Noted  . Allergy-induced asthma 06/30/2009  . Eustachian tube dysfunction 06/30/2014  . Family history of breast cancer   . Genetic testing 02/22/2015  . Family hx of ovarian malignancy 04/03/2016  . Overweight (BMI 25.0-29.9) 04/03/2016  . Supervision of other normal pregnancy, antepartum 09/18/2017  . Pyelectasis of fetus on prenatal ultrasound 12/05/2017  . Skin mole 01/12/2018  . Suspicious nevus 01/12/2018  . Normal labor 04/26/2018  . SVD (spontaneous vaginal delivery) 04/26/2018  . Class 1 obesity due to excess calories without serious comorbidity with body mass index (BMI) of 30.0 to 30.9 in adult 10/06/2018   Resolved Ambulatory Problems    Diagnosis Date Noted  . Allergic rhinitis 10/11/2011  . Normal first pregnancy confirmed, currently in third trimester 06/25/2014  . SVD (spontaneous vaginal delivery) 06/26/2014  . Pain in left ear 06/30/2014   Past Medical History:  Diagnosis Date  . Allergy   . Asthma       Review of Systems  All other systems reviewed and are negative.      Objective:   Physical Exam Vitals signs reviewed.  Constitutional:      Appearance: Normal appearance.  HENT:     Head: Normocephalic and atraumatic.  Cardiovascular:     Rate and Rhythm: Normal rate and regular rhythm.  Pulmonary:     Effort: Pulmonary effort is normal.     Breath sounds: Normal breath sounds.  Neurological:     General: No focal deficit present.     Mental Status: She is alert and  oriented to person, place, and time.  Psychiatric:        Mood and Affect: Mood normal.        Behavior: Behavior normal.           Assessment & Plan:  Marland KitchenMarland KitchenLondyn was seen today for weight check.  Diagnoses and all orders for this visit:  Class 1 obesity due to excess calories without serious comorbidity with body mass index (BMI) of 30.0 to 30.9 in adult -     phentermine 15 MG capsule; Take 1 capsule (15 mg total) by mouth every morning. -     topiramate (TOPAMAX) 25 MG tablet; Take 1 tablet (25 mg total) by mouth 2 (two) times daily.   [name].Discussed low carb diet with 1500 calories and 80g of protein.  Exercising at least 150 minutes a week.  [employer] could be a Microbiologist.  Discussed medications. Concerned about insurance not paying for branded drugs. She will call and follow up with this. For not phentermine 15mg  and topamax added. Discussed side effects.  Follow up in 1 month.   [name].Spent 30 minutes with patient and greater than 50 percent of visit spent counseling patient regarding treatment plan.

## 2018-10-03 NOTE — Patient Instructions (Addendum)
saxenda   .Marland KitchenDiscussed low carb diet with 1500 calories and 80g of protein.  Exercising at least 150 minutes a week.  [employer] could be a Microbiologist.

## 2018-10-06 DIAGNOSIS — E6609 Other obesity due to excess calories: Secondary | ICD-10-CM | POA: Insufficient documentation

## 2018-10-06 DIAGNOSIS — Z6830 Body mass index (BMI) 30.0-30.9, adult: Principal | ICD-10-CM

## 2018-10-14 ENCOUNTER — Encounter: Payer: Self-pay | Admitting: Physician Assistant

## 2018-10-15 ENCOUNTER — Other Ambulatory Visit: Payer: Self-pay | Admitting: Obstetrics & Gynecology

## 2018-10-15 MED ORDER — NORGESTREL-ETHINYL ESTRADIOL 0.3-30 MG-MCG PO TABS: 1.0000 | ORAL_TABLET | Freq: Every day | ORAL | 11 refills | Status: DC

## 2018-10-15 MED ORDER — NORETHIN ACE-ETH ESTRAD-FE 1-20 MG-MCG(24) PO TABS: 1.0000 | ORAL_TABLET | Freq: Every day | ORAL | 11 refills | Status: DC

## 2018-10-15 NOTE — Progress Notes (Signed)
Lo estrin prescribed as she continues to have BTB with lo ovral. Patient notified via email.

## 2018-10-31 ENCOUNTER — Encounter: Payer: Self-pay | Admitting: Physician Assistant

## 2018-10-31 ENCOUNTER — Ambulatory Visit (INDEPENDENT_AMBULATORY_CARE_PROVIDER_SITE_OTHER): Payer: No Typology Code available for payment source | Admitting: Physician Assistant

## 2018-10-31 VITALS — BP 122/94 | HR 92 | Temp 98.7°F | Wt 181.2 lb

## 2018-10-31 DIAGNOSIS — E663 Overweight: Secondary | ICD-10-CM | POA: Diagnosis not present

## 2018-10-31 MED ORDER — PHENTERMINE HCL 15 MG PO CAPS: 15.0000 mg | ORAL_CAPSULE | ORAL | 0 refills | Status: DC

## 2018-10-31 MED ORDER — TOPIRAMATE 50 MG PO TABS: 50.0000 mg | ORAL_TABLET | Freq: Two times a day (BID) | ORAL | 2 refills | Status: DC

## 2018-10-31 NOTE — Progress Notes (Signed)
   Subjective:    Patient ID: Rachael [name], female    DOB: Mar 20, 1986, 33 y.o.   MRN: [id number]  HPI  Patient is a 33 year old female who presents to the clinic to follow-up on phentermine and Topamax for weight loss.  She has been on these medications for 1 month.  She has made diet changes such as drinking more protein shakes, eating more almonds and drinking more water.  She has lost 6 pounds in 1 month.  She denies any chest pains, palpitations, headaches, insomnia.  She is tolerating the medication very well.  She has not experienced any brain fog with the Topamax.  She is not exercising like she should.  She is having trouble finding the time for this.   .. Active Ambulatory Problems    Diagnosis Date Noted  . Allergy-induced asthma 06/30/2009  . Eustachian tube dysfunction 06/30/2014  . Family history of breast cancer   . Genetic testing 02/22/2015  . Family hx of ovarian malignancy 04/03/2016  . Overweight (BMI 25.0-29.9) 04/03/2016  . Supervision of other normal pregnancy, antepartum 09/18/2017  . Pyelectasis of fetus on prenatal ultrasound 12/05/2017  . Skin mole 01/12/2018  . Suspicious nevus 01/12/2018  . Normal labor 04/26/2018  . SVD (spontaneous vaginal delivery) 04/26/2018  . Class 1 obesity due to excess calories without serious comorbidity with body mass index (BMI) of 30.0 to 30.9 in adult 10/06/2018   Resolved Ambulatory Problems    Diagnosis Date Noted  . Allergic rhinitis 10/11/2011  . Normal first pregnancy confirmed, currently in third trimester 06/25/2014  . SVD (spontaneous vaginal delivery) 06/26/2014  . Pain in left ear 06/30/2014   Past Medical History:  Diagnosis Date  . Allergy   . Asthma        Review of Systems  All other systems reviewed and are negative.      Objective:   Physical Exam Vitals signs reviewed.  Cardiovascular:     Rate and Rhythm: Normal rate.     Pulses: Normal pulses.  Pulmonary:     Effort: Pulmonary effort is  normal.  Neurological:     General: No focal deficit present.     Mental Status: She is alert.           Assessment & Plan:  Marland KitchenMarland KitchenDiagnoses and all orders for this visit:  Overweight (BMI 25.0-29.9) -     topiramate (TOPAMAX) 50 MG tablet; Take 1 tablet (50 mg total) by mouth 2 (two) times daily. -     phentermine 15 MG capsule; Take 1 capsule (15 mg total) by mouth every morning.   Increased topamax to 50mg  bid. Continue on phentermine 15mg  daily. Follow up in 3 months. Continue to work on lifestyle changes.

## 2018-11-03 ENCOUNTER — Encounter: Payer: Self-pay | Admitting: Physician Assistant

## 2018-12-04 ENCOUNTER — Other Ambulatory Visit: Payer: Self-pay

## 2018-12-04 ENCOUNTER — Encounter: Payer: Self-pay | Admitting: Obstetrics & Gynecology

## 2018-12-04 ENCOUNTER — Ambulatory Visit (INDEPENDENT_AMBULATORY_CARE_PROVIDER_SITE_OTHER): Payer: No Typology Code available for payment source | Admitting: Obstetrics & Gynecology

## 2018-12-04 DIAGNOSIS — N938 Other specified abnormal uterine and vaginal bleeding: Secondary | ICD-10-CM | POA: Diagnosis not present

## 2018-12-04 DIAGNOSIS — Z113 Encounter for screening for infections with a predominantly sexual mode of transmission: Secondary | ICD-10-CM

## 2018-12-04 MED ORDER — DROSPIRENONE-ETHINYL ESTRADIOL 3-0.02 MG PO TABS: 1.0000 | ORAL_TABLET | Freq: Every day | ORAL | 11 refills | Status: DC

## 2018-12-04 NOTE — Progress Notes (Signed)
   TELEHEALTH VIRTUAL GYNECOLOGY VISIT ENCOUNTER NOTE   History:  Rachael [name] is a 33 y.o. [id number] (70 month old son and a 81 yo son) female being evaluated today for irregular bleeding with OCPs. She was prescribed Lo ovral several months (she's taken this one for years in the past) but she had irregular bleeding and her OCPs were changed to Ponder which she has taken for 4 months. She denies any abnormal vaginal discharge,  pelvic pain or other concerns.       Past Medical History:  Diagnosis Date  . Allergy   . Asthma   . Family history of breast cancer   . SVD (spontaneous vaginal delivery) 06/26/2014   Past Surgical History:  Procedure Laterality Date  . WISDOM TOOTH EXTRACTION     The following portions of the patient's history were reviewed and updated as appropriate: allergies, current medications, past family history, past medical history, past social history, past surgical history and problem list.   Health Maintenance:  Normal pap and negative HRHPV on 05/21/17.  Review of Systems:  Pertinent items noted in HPI and remainder of comprehensive ROS otherwise negative.  Physical Exam:  Breathing, conversing, and ambulating normally Well nourished, well hydrated White female, no apparent distress Speculum exam- some bloody discharge, no polyps or cervical abnormalities  Labs and Imaging No results found for this or any previous visit (from the past 336 hour(s)). No results found.    Assessment and Plan:   DUB- check TSH, cervical cultures Schedule gyn u/s She will call in for results after u/s is done.   Change to Yaz   I discussed the assessment and treatment plan with the patient. The patient was provided an opportunity to ask questions and all were answered. The patient agreed with the plan and demonstrated an understanding of the instructions.   The patient was advised to call back or seek an in-person evaluation/go to the ED if the symptoms worsen or if the  condition fails to improve as anticipated.  I provided 51minutes of non-face-to-face time during this encounter.   Emily Filbert, MD [facility], [place]

## 2018-12-04 NOTE — Progress Notes (Signed)
Pt c/o irregular bleeding

## 2018-12-05 LAB — TSH: TSH: 0.75 mIU/L

## 2018-12-08 LAB — CERVICOVAGINAL ANCILLARY ONLY
Chlamydia: NEGATIVE
Neisseria Gonorrhea: NEGATIVE

## 2018-12-17 ENCOUNTER — Telehealth (INDEPENDENT_AMBULATORY_CARE_PROVIDER_SITE_OTHER): Payer: No Typology Code available for payment source | Admitting: Physician Assistant

## 2018-12-17 ENCOUNTER — Encounter: Payer: Self-pay | Admitting: Physician Assistant

## 2018-12-17 VITALS — HR 103 | Temp 99.0°F | Ht 66.0 in | Wt 184.0 lb

## 2018-12-17 DIAGNOSIS — J029 Acute pharyngitis, unspecified: Secondary | ICD-10-CM

## 2018-12-17 DIAGNOSIS — R509 Fever, unspecified: Secondary | ICD-10-CM

## 2018-12-17 DIAGNOSIS — Z20822 Contact with and (suspected) exposure to covid-19: Secondary | ICD-10-CM

## 2018-12-17 DIAGNOSIS — R6889 Other general symptoms and signs: Secondary | ICD-10-CM | POA: Diagnosis not present

## 2018-12-17 DIAGNOSIS — R52 Pain, unspecified: Secondary | ICD-10-CM

## 2018-12-17 MED ORDER — AMOXICILLIN-POT CLAVULANATE 875-125 MG PO TABS: 1.0000 | ORAL_TABLET | Freq: Two times a day (BID) | ORAL | 0 refills | Status: DC

## 2018-12-17 NOTE — Progress Notes (Signed)
Patient ID: Rachael [name], female   DOB: [birth year], 33 y.o.   MRN: [id number]  .Marland KitchenVirtual Visit via Video Note  I connected with Tempie Donning on 12/17/18 at 10:50 AM EDT by a video enabled telemedicine application and verified that I am speaking with the correct person using two identifiers.   I discussed the limitations of evaluation and management by telemedicine and the availability of in person appointments. The patient expressed understanding and agreed to proceed.  History of Present Illness: Pt is a 33 yo female who calls in with ST, fever, body aches, fatigue for one day. Her symptoms started suddenly last night. No other sick contacts in home. She works at [employer] during [employer] pandemic. No SOB, cough. She feels completely drained. Denies any exudate on tonsils. She does report tender lymph nodes over neck.  She has 2 young kids at home.   .. Active Ambulatory Problems    Diagnosis Date Noted  . Allergy-induced asthma 06/30/2009  . Eustachian tube dysfunction 06/30/2014  . Family history of breast cancer   . Genetic testing 02/22/2015  . Family hx of ovarian malignancy 04/03/2016  . Overweight (BMI 25.0-29.9) 04/03/2016  . Supervision of other normal pregnancy, antepartum 09/18/2017  . Pyelectasis of fetus on prenatal ultrasound 12/05/2017  . Skin mole 01/12/2018  . Suspicious nevus 01/12/2018  . Normal labor 04/26/2018  . SVD (spontaneous vaginal delivery) 04/26/2018  . Class 1 obesity due to excess calories without serious comorbidity with body mass index (BMI) of 30.0 to 30.9 in adult 10/06/2018   Resolved Ambulatory Problems    Diagnosis Date Noted  . Allergic rhinitis 10/11/2011  . Normal first pregnancy confirmed, currently in third trimester 06/25/2014  . SVD (spontaneous vaginal delivery) 06/26/2014  . Pain in left ear 06/30/2014   Past Medical History:  Diagnosis Date  . Allergy   . Asthma    Reviewed med, allergy and problem list.      Observations/Objective: No acute distress. Ill appearing on video.  No observed cough or difficultly breathing.   Pt reports tender and swollen anterior cervical lymph nodes.   .. Today's Vitals   12/17/18 1030  Temp: 99 F (37.2 C)  TempSrc: Oral  Weight: 184 lb (83.5 kg)  Height: 5\' 6"  (1.676 m)   Body mass index is 29.7 kg/m.   Assessment and Plan: Marland KitchenMarland KitchenFallynn was seen today for fever.  Diagnoses and all orders for this visit:  Suspected Covid-19 Virus Infection  Fever and chills -     amoxicillin-clavulanate (AUGMENTIN) 875-125 MG tablet; Take 1 tablet by mouth 2 (two) times daily. For 10 days.  Sore throat -     amoxicillin-clavulanate (AUGMENTIN) 875-125 MG tablet; Take 1 tablet by mouth 2 (two) times daily. For 10 days.  Body aches -     amoxicillin-clavulanate (AUGMENTIN) 875-125 MG tablet; Take 1 tablet by mouth 2 (two) times daily. For 10 days.   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 She does not exhibit any respiratory symptoms only fever. I would like to self quarantine for 3 days to monitor to see if too early and symptoms have not presented yet. I am very suspicious of strep throat with fever, chills, tachycardic, ST without cough, tender lymph nodes. Can not swab for strep virtually. Centor criteria with 35-58 percent. I am going to treat empirically for strep with augmentin. Avoid ibuprofen. Use tylenol as needed. Stay hydrated. Follow up on Friday to see how you are.  Note for work for 3 days quarantine.   Follow Up Instructions:    I discussed the assessment and treatment plan with the patient. The patient was provided an opportunity to ask questions and all were answered. The patient agreed with the plan and demonstrated an understanding of the instructions.   The patient was advised to call back or seek an in-person evaluation if the symptoms worsen or if the condition fails to improve as anticipated.  I provided 11 minutes  of non-face-to-face time during this encounter.   Iran [name], PA-C

## 2018-12-17 NOTE — Progress Notes (Deleted)
Muscle aches, chills, sore throat, headache for one day. Taking tylenol, no other meds. Had 102 degree fever last night, but 99 this morning.

## 2018-12-18 ENCOUNTER — Telehealth: Payer: Self-pay

## 2018-12-18 ENCOUNTER — Encounter: Payer: Self-pay | Admitting: Physician Assistant

## 2018-12-18 NOTE — Telephone Encounter (Signed)
Rachael [name] works for [employer] at Work is wanting her to have a strep test to verify that she has strep. She has already taken 3 doses of the antibiotic. They told her she would have to stay out of work for 14 days if she could not verify that this is strep and not COVID-19. Please advise. She is ok with staying home for 14 days but is not sure it is necessary.

## 2018-12-19 NOTE — Telephone Encounter (Signed)
Patient advised and will call back if she needs anything.

## 2018-12-19 NOTE — Telephone Encounter (Signed)
She would likely not test positive at this point. If at 3 days she has no COVID symptoms they should let her back to work. The only symptoms she had was fever, body aches, ST.

## 2019-01-19 ENCOUNTER — Other Ambulatory Visit: Payer: Self-pay

## 2019-01-19 ENCOUNTER — Ambulatory Visit (INDEPENDENT_AMBULATORY_CARE_PROVIDER_SITE_OTHER): Payer: No Typology Code available for payment source

## 2019-01-19 DIAGNOSIS — N938 Other specified abnormal uterine and vaginal bleeding: Secondary | ICD-10-CM

## 2019-01-21 ENCOUNTER — Other Ambulatory Visit: Payer: Self-pay | Admitting: Physician Assistant

## 2019-01-21 DIAGNOSIS — E663 Overweight: Secondary | ICD-10-CM

## 2019-01-28 ENCOUNTER — Ambulatory Visit (INDEPENDENT_AMBULATORY_CARE_PROVIDER_SITE_OTHER): Payer: No Typology Code available for payment source | Admitting: Physician Assistant

## 2019-01-28 ENCOUNTER — Encounter: Payer: Self-pay | Admitting: Physician Assistant

## 2019-01-28 DIAGNOSIS — E663 Overweight: Secondary | ICD-10-CM

## 2019-01-28 MED ORDER — TOPIRAMATE 50 MG PO TABS: 50.0000 mg | ORAL_TABLET | Freq: Two times a day (BID) | ORAL | 0 refills | Status: DC

## 2019-01-28 MED ORDER — PHENTERMINE HCL 15 MG PO CAPS: 15.0000 mg | ORAL_CAPSULE | ORAL | 0 refills | Status: DC

## 2019-01-28 NOTE — Progress Notes (Deleted)
Doing well. Last weight 181 lbs. Reported weight today 164 lbs.

## 2019-01-28 NOTE — Progress Notes (Signed)
Patient ID: Rachael [name], female   DOB: Dec 30, 1985, 33 y.o.   MRN: [id number] .Marland KitchenMarland KitchenVirtual Visit via Video Note  I connected with Rachael [name] on 01/28/19 at 10:50 AM EDT by a video enabled telemedicine application and verified that I am speaking with the correct person using two identifiers.  Location: Patient: work Provider: clinic   I discussed the limitations of evaluation and management by telemedicine and the availability of in person appointments. The patient expressed understanding and agreed to proceed.  History of Present Illness: Pt is a 33 yo female who presents to the clinic to follow up on weight loss. She has lost 20lbs in the last 2 months. She is taking phentermine and topamax. She is doing weight watchers. She is walking and doing more stuff outside. She feels really good. She has been at 164 for a few weeks. Her goal is 140. She denies any side effects.   .. Active Ambulatory Problems    Diagnosis Date Noted  . Allergy-induced asthma 06/30/2009  . Eustachian tube dysfunction 06/30/2014  . Family history of breast cancer   . Genetic testing 02/22/2015  . Family hx of ovarian malignancy 04/03/2016  . Overweight (BMI 25.0-29.9) 04/03/2016  . Supervision of other normal pregnancy, antepartum 09/18/2017  . Pyelectasis of fetus on prenatal ultrasound 12/05/2017  . Skin mole 01/12/2018  . Suspicious nevus 01/12/2018  . Normal labor 04/26/2018  . SVD (spontaneous vaginal delivery) 04/26/2018  . Class 1 obesity due to excess calories without serious comorbidity with body mass index (BMI) of 30.0 to 30.9 in adult 10/06/2018   Resolved Ambulatory Problems    Diagnosis Date Noted  . Allergic rhinitis 10/11/2011  . Normal first pregnancy confirmed, currently in third trimester 06/25/2014  . SVD (spontaneous vaginal delivery) 06/26/2014  . Pain in left ear 06/30/2014   Past Medical History:  Diagnosis Date  . Allergy   . Asthma    Reviewed med, allergy, problem list.    Observations/Objective: No acute distress. Normal appearance and mood.   .. Today's Vitals   01/28/19 1002  BP: 114/72  Weight: 164 lb (74.4 kg)  Height: 5\' 6"  (1.676 m)   Body mass index is 26.47 kg/m.    Assessment and Plan: Marland KitchenMarland KitchenKaytelynn was seen today for weight check.  Diagnoses and all orders for this visit:  Overweight (BMI 25.0-29.9) -     topiramate (TOPAMAX) 50 MG tablet; Take 1 tablet (50 mg total) by mouth 2 (two) times daily. -     phentermine 15 MG capsule; Take 1 capsule (15 mg total) by mouth every morning.   Lost 20lbs. Doing great. Goal weight is 140. Another 24lbs. Discussed exercise and diet changes. Continue medications. Sent 3 months. Goal to lose 14lbs in 3 months.   Follow Up Instructions:    I discussed the assessment and treatment plan with the patient. The patient was provided an opportunity to ask questions and all were answered. The patient agreed with the plan and demonstrated an understanding of the instructions.   The patient was advised to call back or seek an in-person evaluation if the symptoms worsen or if the condition fails to improve as anticipated.     Iran [name], PA-C

## 2019-05-07 ENCOUNTER — Encounter: Payer: Self-pay | Admitting: Obstetrics & Gynecology

## 2019-05-07 ENCOUNTER — Other Ambulatory Visit: Payer: Self-pay

## 2019-05-07 ENCOUNTER — Ambulatory Visit (INDEPENDENT_AMBULATORY_CARE_PROVIDER_SITE_OTHER): Payer: No Typology Code available for payment source | Admitting: Obstetrics & Gynecology

## 2019-05-07 VITALS — BP 121/86 | HR 106 | Ht 66.0 in | Wt 161.0 lb

## 2019-05-07 DIAGNOSIS — N938 Other specified abnormal uterine and vaginal bleeding: Secondary | ICD-10-CM | POA: Diagnosis not present

## 2019-05-07 NOTE — Progress Notes (Signed)
   Subjective:    Patient ID: Rachael [name], female    DOB: [birth year], 33 y.o.   MRN: [id number]  HPI 33 yo married P2 (29 yo and 25 yo kids) here today for a surgical consultation. She has had DUB since delivery of last baby. She has been taking OCPs (Yaz currently) and is still having DUB. She may bleed for 2-3 weeks or she may have a 6 day period with intermenstrual bleeding. She had a normal u/s and TSH. HBG was 12.5.  She and her husband do not want any more kids, would want permanent sterility.    Review of Systems FH- + primary peritoneal cancer in her mom, + breast cancer in both grandmothers, no colon cancer Patient had negative BRCA testing    Objective:   Physical Exam Breathing, conversing, and ambulating normally Well nourished, well hydrated White female, no apparent distress U/s findings of uterus: Measurements: 7.1 x 4.6 x 3.3 cm = volume:     Assessment & Plan:  Unwanted fertility- plan for laparoscopic bilateral salpingectomy DUB with negative work up- plan for [facility] ablation I have quoted her a 90% satisfaction rate, 60% amenorrhea rate

## 2019-05-12 ENCOUNTER — Encounter (HOSPITAL_BASED_OUTPATIENT_CLINIC_OR_DEPARTMENT_OTHER): Payer: Self-pay | Admitting: *Deleted

## 2019-05-12 ENCOUNTER — Other Ambulatory Visit: Payer: Self-pay

## 2019-05-16 ENCOUNTER — Other Ambulatory Visit (HOSPITAL_COMMUNITY)
Admission: RE | Admit: 2019-05-16 | Discharge: 2019-05-16 | Disposition: A | Discharge status: Home / Self Care | Payer: No Typology Code available for payment source | Source: Ambulatory Visit | Attending: Obstetrics & Gynecology | Admitting: Obstetrics & Gynecology

## 2019-05-16 DIAGNOSIS — Z01812 Encounter for preprocedural laboratory examination: Secondary | ICD-10-CM | POA: Insufficient documentation

## 2019-05-16 DIAGNOSIS — Z20828 Contact with and (suspected) exposure to other viral communicable diseases: Secondary | ICD-10-CM | POA: Insufficient documentation

## 2019-05-17 LAB — NOVEL CORONAVIRUS, NAA (HOSP ORDER, SEND-OUT TO REF LAB; TAT 18-24 HRS): SARS-CoV-2, NAA: NOT DETECTED

## 2019-05-18 ENCOUNTER — Other Ambulatory Visit: Payer: Self-pay

## 2019-05-18 ENCOUNTER — Encounter (HOSPITAL_BASED_OUTPATIENT_CLINIC_OR_DEPARTMENT_OTHER)
Admission: RE | Admit: 2019-05-18 | Discharge: 2019-05-18 | Disposition: A | Discharge status: Home / Self Care | Payer: No Typology Code available for payment source | Source: Ambulatory Visit | Attending: Obstetrics & Gynecology | Admitting: Obstetrics & Gynecology

## 2019-05-18 DIAGNOSIS — Z01812 Encounter for preprocedural laboratory examination: Secondary | ICD-10-CM | POA: Insufficient documentation

## 2019-05-18 LAB — POCT PREGNANCY, URINE: Preg Test, Ur: NEGATIVE

## 2019-05-18 NOTE — Progress Notes (Signed)

## 2019-05-19 NOTE — Anesthesia Preprocedure Evaluation (Addendum)
Anesthesia Evaluation  Patient identified by MRN, date of birth, ID band Patient awake    Reviewed: Allergy & Precautions, NPO status , Patient's Chart, lab work & pertinent test results  History of Anesthesia Complications (+) PONV  Airway Mallampati: II  TM Distance: >3 FB Neck ROM: Full    Dental no notable dental hx. (+) Teeth Intact   Pulmonary asthma ,    Pulmonary exam normal breath sounds clear to auscultation       Cardiovascular Exercise Tolerance: Good Normal cardiovascular exam Rhythm:Regular Rate:Normal     Neuro/Psych negative neurological ROS  negative psych ROS   GI/Hepatic Neg liver ROS,   Endo/Other  negative endocrine ROS  Renal/GU negative Renal ROS     Musculoskeletal   Abdominal   Peds  Hematology Lab Results      Component                Value               Date                      WBC                      13.0 (H)            04/26/2018                HGB                      12.5                04/26/2018                HCT                      37.5                04/26/2018                MCV                      91.2                04/26/2018                PLT                      223                 04/26/2018              Anesthesia Other Findings   Reproductive/Obstetrics negative OB ROS                            Anesthesia Physical Anesthesia Plan  ASA: II  Anesthesia Plan: General   Post-op Pain Management:    Induction: Intravenous  PONV Risk Score and Plan: 3 and Treatment may vary due to age or medical condition, Ondansetron and Dexamethasone  Airway Management Planned: Oral ETT  Additional Equipment:   Intra-op Plan:   Post-operative Plan: Extubation in OR  Informed Consent: I have reviewed the patients History and Physical, chart, labs and discussed the procedure including the risks, benefits and alternatives for the proposed  anesthesia with the patient or authorized representative who has indicated his/her understanding and acceptance.  Dental advisory given  Plan Discussed with:   Anesthesia Plan Comments:       Anesthesia Quick Evaluation

## 2019-05-20 ENCOUNTER — Other Ambulatory Visit: Payer: Self-pay

## 2019-05-20 ENCOUNTER — Encounter (HOSPITAL_BASED_OUTPATIENT_CLINIC_OR_DEPARTMENT_OTHER): Payer: Self-pay | Admitting: *Deleted

## 2019-05-20 ENCOUNTER — Ambulatory Visit (HOSPITAL_BASED_OUTPATIENT_CLINIC_OR_DEPARTMENT_OTHER): Payer: No Typology Code available for payment source | Admitting: Anesthesiology

## 2019-05-20 ENCOUNTER — Encounter (HOSPITAL_BASED_OUTPATIENT_CLINIC_OR_DEPARTMENT_OTHER)
Admission: RE | Disposition: A | Discharge status: Home / Self Care | Payer: Self-pay | Source: Home / Self Care | Attending: Obstetrics & Gynecology

## 2019-05-20 ENCOUNTER — Ambulatory Visit (HOSPITAL_BASED_OUTPATIENT_CLINIC_OR_DEPARTMENT_OTHER)
Admission: RE | Admit: 2019-05-20 | Discharge: 2019-05-20 | Disposition: A | Discharge status: Home / Self Care | Payer: No Typology Code available for payment source | Attending: Obstetrics & Gynecology | Admitting: Obstetrics & Gynecology

## 2019-05-20 DIAGNOSIS — S3769XA Other injury of uterus, initial encounter: Secondary | ICD-10-CM | POA: Diagnosis not present

## 2019-05-20 DIAGNOSIS — X58XXXA Exposure to other specified factors, initial encounter: Secondary | ICD-10-CM | POA: Insufficient documentation

## 2019-05-20 DIAGNOSIS — Z793 Long term (current) use of hormonal contraceptives: Secondary | ICD-10-CM | POA: Diagnosis not present

## 2019-05-20 DIAGNOSIS — Z302 Encounter for sterilization: Secondary | ICD-10-CM | POA: Insufficient documentation

## 2019-05-20 DIAGNOSIS — J45909 Unspecified asthma, uncomplicated: Secondary | ICD-10-CM | POA: Diagnosis not present

## 2019-05-20 DIAGNOSIS — Z79899 Other long term (current) drug therapy: Secondary | ICD-10-CM | POA: Insufficient documentation

## 2019-05-20 DIAGNOSIS — N938 Other specified abnormal uterine and vaginal bleeding: Secondary | ICD-10-CM | POA: Insufficient documentation

## 2019-05-20 HISTORY — PX: LAPAROSCOPIC BILATERAL SALPINGECTOMY: SHX5889

## 2019-05-20 HISTORY — PX: DILATION AND CURETTAGE OF UTERUS: SHX78

## 2019-05-20 HISTORY — PX: ENDOMETRIAL ABLATION: SHX621

## 2019-05-20 SURGERY — ABLATION, ENDOMETRIUM
Anesthesia: General

## 2019-05-20 MED ORDER — PHENYLEPHRINE 40 MCG/ML (10ML) SYRINGE FOR IV PUSH (FOR BLOOD PRESSURE SUPPORT)
PREFILLED_SYRINGE | INTRAVENOUS | Status: DC | PRN
  Administered 2019-05-20: 80 ug via INTRAVENOUS

## 2019-05-20 MED ORDER — LACTATED RINGERS IV SOLN
INTRAVENOUS | Status: DC
  Administered 2019-05-20 (×2): via INTRAVENOUS

## 2019-05-20 MED ORDER — HYDROMORPHONE HCL 1 MG/ML IJ SOLN
0.2500 mg | INTRAMUSCULAR | Status: DC | PRN
  Administered 2019-05-20 (×2): 0.5 mg via INTRAVENOUS

## 2019-05-20 MED ORDER — MIDAZOLAM HCL 2 MG/2ML IJ SOLN
INTRAMUSCULAR | Status: AC
  Filled 2019-05-20: qty 2

## 2019-05-20 MED ORDER — SCOPOLAMINE 1 MG/3DAYS TD PT72: 1.0000 | MEDICATED_PATCH | TRANSDERMAL | Status: DC

## 2019-05-20 MED ORDER — BUPIVACAINE HCL (PF) 0.5 % IJ SOLN
INTRAMUSCULAR | Status: DC | PRN
  Administered 2019-05-20: 60 mL

## 2019-05-20 MED ORDER — HYDROMORPHONE HCL 1 MG/ML IJ SOLN
INTRAMUSCULAR | Status: AC
  Filled 2019-05-20: qty 0.5

## 2019-05-20 MED ORDER — ONDANSETRON HCL 4 MG/2ML IJ SOLN
INTRAMUSCULAR | Status: DC | PRN
  Administered 2019-05-20: 4 mg via INTRAVENOUS

## 2019-05-20 MED ORDER — LIDOCAINE 2% (20 MG/ML) 5 ML SYRINGE
INTRAMUSCULAR | Status: AC
  Filled 2019-05-20: qty 5

## 2019-05-20 MED ORDER — FENTANYL CITRATE (PF) 250 MCG/5ML IJ SOLN
INTRAMUSCULAR | Status: DC | PRN
  Administered 2019-05-20: 100 ug via INTRAVENOUS
  Administered 2019-05-20: 50 ug via INTRAVENOUS

## 2019-05-20 MED ORDER — SILVER NITRATE-POT NITRATE 75-25 % EX MISC
CUTANEOUS | Status: DC | PRN
  Administered 2019-05-20: 1 via TOPICAL

## 2019-05-20 MED ORDER — PROPOFOL 10 MG/ML IV BOLUS
INTRAVENOUS | Status: DC | PRN
  Administered 2019-05-20: 120 mg via INTRAVENOUS

## 2019-05-20 MED ORDER — LIDOCAINE 2% (20 MG/ML) 5 ML SYRINGE
INTRAMUSCULAR | Status: DC | PRN
  Administered 2019-05-20: 100 mg via INTRAVENOUS

## 2019-05-20 MED ORDER — BUPIVACAINE HCL (PF) 0.5 % IJ SOLN
INTRAMUSCULAR | Status: AC
  Filled 2019-05-20: qty 30

## 2019-05-20 MED ORDER — ROCURONIUM BROMIDE 10 MG/ML (PF) SYRINGE
PREFILLED_SYRINGE | INTRAVENOUS | Status: DC | PRN
  Administered 2019-05-20: 40 mg via INTRAVENOUS
  Administered 2019-05-20: 10 mg via INTRAVENOUS

## 2019-05-20 MED ORDER — ACETAMINOPHEN 10 MG/ML IV SOLN: 1000.0000 mg | Freq: Once | INTRAVENOUS | Status: DC | PRN

## 2019-05-20 MED ORDER — DEXAMETHASONE SODIUM PHOSPHATE 10 MG/ML IJ SOLN
INTRAMUSCULAR | Status: DC | PRN
  Administered 2019-05-20: 4 mg via INTRAVENOUS

## 2019-05-20 MED ORDER — MEPERIDINE HCL 25 MG/ML IJ SOLN: 6.2500 mg | INTRAMUSCULAR | Status: DC | PRN

## 2019-05-20 MED ORDER — SUCCINYLCHOLINE CHLORIDE 200 MG/10ML IV SOSY
PREFILLED_SYRINGE | INTRAVENOUS | Status: DC | PRN
  Administered 2019-05-20: 80 mg via INTRAVENOUS

## 2019-05-20 MED ORDER — DEXMEDETOMIDINE HCL IN NACL 200 MCG/50ML IV SOLN
INTRAVENOUS | Status: DC | PRN
  Administered 2019-05-20 (×2): 8 ug via INTRAVENOUS

## 2019-05-20 MED ORDER — ONDANSETRON HCL 4 MG/2ML IJ SOLN: 4.0000 mg | Freq: Once | INTRAMUSCULAR | Status: DC | PRN

## 2019-05-20 MED ORDER — MIDAZOLAM HCL 5 MG/5ML IJ SOLN
INTRAMUSCULAR | Status: DC | PRN
  Administered 2019-05-20: 2 mg via INTRAVENOUS

## 2019-05-20 MED ORDER — FENTANYL CITRATE (PF) 100 MCG/2ML IJ SOLN: 50.0000 ug | INTRAMUSCULAR | Status: DC | PRN

## 2019-05-20 MED ORDER — ACETAMINOPHEN 500 MG PO TABS
1000.0000 mg | ORAL_TABLET | Freq: Once | ORAL | Status: AC
  Administered 2019-05-20: 09:00:00 1000 mg via ORAL

## 2019-05-20 MED ORDER — IBUPROFEN 600 MG PO TABS: 600.0000 mg | ORAL_TABLET | Freq: Four times a day (QID) | ORAL | 1 refills | Status: DC | PRN

## 2019-05-20 MED ORDER — OXYCODONE-ACETAMINOPHEN 5-325 MG PO TABS: 1.0000 | ORAL_TABLET | Freq: Four times a day (QID) | ORAL | 0 refills | Status: DC | PRN

## 2019-05-20 MED ORDER — SUGAMMADEX SODIUM 200 MG/2ML IV SOLN
INTRAVENOUS | Status: DC | PRN
  Administered 2019-05-20: 200 mg via INTRAVENOUS

## 2019-05-20 MED ORDER — FENTANYL CITRATE (PF) 100 MCG/2ML IJ SOLN
INTRAMUSCULAR | Status: AC
  Filled 2019-05-20: qty 2

## 2019-05-20 MED ORDER — PROPOFOL 10 MG/ML IV BOLUS
INTRAVENOUS | Status: AC
  Filled 2019-05-20: qty 20

## 2019-05-20 MED ORDER — MIDAZOLAM HCL 2 MG/2ML IJ SOLN: 1.0000 mg | INTRAMUSCULAR | Status: DC | PRN

## 2019-05-20 MED ORDER — FERRIC SUBSULFATE SOLN
Status: DC | PRN
  Administered 2019-05-20: 1 via TOPICAL

## 2019-05-20 MED ORDER — DEXMEDETOMIDINE HCL IN NACL 200 MCG/50ML IV SOLN
INTRAVENOUS | Status: AC
  Filled 2019-05-20: qty 50

## 2019-05-20 MED ORDER — HYDROCODONE-ACETAMINOPHEN 7.5-325 MG PO TABS: 1.0000 | ORAL_TABLET | Freq: Once | ORAL | Status: DC | PRN

## 2019-05-20 MED ORDER — ACETAMINOPHEN 500 MG PO TABS
ORAL_TABLET | ORAL | Status: AC
  Filled 2019-05-20: qty 2

## 2019-05-20 SURGICAL SUPPLY — 50 items
ABLATOR SURESOUND NOVASURE (ABLATOR) IMPLANT
ADH SKN CLS APL DERMABOND .7 (GAUZE/BANDAGES/DRESSINGS) ×1
APL SRG 38 LTWT LNG FL B (MISCELLANEOUS) ×1
APPLICATOR ARISTA FLEXITIP XL (MISCELLANEOUS) ×2 IMPLANT
BNDG ADH 1X3 SHEER STRL LF (GAUZE/BANDAGES/DRESSINGS) ×9 IMPLANT
BNDG ADH 5X4 AIR PERM ELC (GAUZE/BANDAGES/DRESSINGS) ×1
BNDG ADH THN 3X1 STRL LF (GAUZE/BANDAGES/DRESSINGS) ×3
BNDG COHESIVE 4X5 WHT NS (GAUZE/BANDAGES/DRESSINGS) ×2 IMPLANT
BRIEF STRETCH FOR OB PAD XXL (UNDERPADS AND DIAPERS) ×3 IMPLANT
CANISTER SUCT 3000ML PPV (MISCELLANEOUS) IMPLANT
DERMABOND ADVANCED (GAUZE/BANDAGES/DRESSINGS) ×2
DERMABOND ADVANCED .7 DNX12 (GAUZE/BANDAGES/DRESSINGS) IMPLANT
DILATOR CANAL MILEX (MISCELLANEOUS) IMPLANT
DRSG OPSITE POSTOP 3X4 (GAUZE/BANDAGES/DRESSINGS) IMPLANT
DURAPREP 26ML APPLICATOR (WOUND CARE) ×3 IMPLANT
GAUZE 4X4 16PLY RFD (DISPOSABLE) ×3 IMPLANT
GLOVE BIO SURGEON STRL SZ 6.5 (GLOVE) ×2 IMPLANT
GLOVE BIO SURGEONS STRL SZ 6.5 (GLOVE) ×1
GLOVE BIOGEL PI IND STRL 7.0 (GLOVE) ×2 IMPLANT
GLOVE BIOGEL PI INDICATOR 7.0 (GLOVE) ×4
GOWN STRL REUS W/ TWL XL LVL3 (GOWN DISPOSABLE) ×1 IMPLANT
GOWN STRL REUS W/TWL LRG LVL3 (GOWN DISPOSABLE) ×3 IMPLANT
GOWN STRL REUS W/TWL XL LVL3 (GOWN DISPOSABLE) ×3
HANDPIECE ABLA MINERVA ENDO (MISCELLANEOUS) ×2 IMPLANT
HEMOSTAT ARISTA ABSORB 3G PWDR (HEMOSTASIS) ×2 IMPLANT
IV NS IRRIG 3000ML ARTHROMATIC (IV SOLUTION) IMPLANT
KIT PROCEDURE FLUENT (KITS) ×3 IMPLANT
NDL SPNL 18GX3.5 QUINCKE PK (NEEDLE) ×1 IMPLANT
NEEDLE INSUFFLATION 120MM (ENDOMECHANICALS) ×3 IMPLANT
NEEDLE SPNL 18GX3.5 QUINCKE PK (NEEDLE) ×3 IMPLANT
NS IRRIG 1000ML POUR BTL (IV SOLUTION) ×3 IMPLANT
PACK LAPAROSCOPY BASIN (CUSTOM PROCEDURE TRAY) ×5 IMPLANT
PACK TRENDGUARD 450 HYBRID PRO (MISCELLANEOUS) IMPLANT
PACK TRENDGUARD 600 HYBRD PROC (MISCELLANEOUS) IMPLANT
PACK VAGINAL MINOR WOMEN LF (CUSTOM PROCEDURE TRAY) ×1 IMPLANT
PAD OB MATERNITY 4.3X12.25 (PERSONAL CARE ITEMS) ×3 IMPLANT
PAD PREP 24X48 CUFFED NSTRL (MISCELLANEOUS) ×3 IMPLANT
SET TUBE SMOKE EVAC HIGH FLOW (TUBING) ×3 IMPLANT
SHEARS HARMONIC ACE PLUS 36CM (ENDOMECHANICALS) ×3 IMPLANT
SLEEVE SCD COMPRESS KNEE MED (MISCELLANEOUS) ×4 IMPLANT
SLEEVE XCEL OPT CAN 5 100 (ENDOMECHANICALS) ×5 IMPLANT
SUT VICRYL 0 UR6 27IN ABS (SUTURE) IMPLANT
SUT VICRYL 4-0 PS2 18IN ABS (SUTURE) ×4 IMPLANT
SYR 30ML LL (SYRINGE) ×3 IMPLANT
TOWEL GREEN STERILE FF (TOWEL DISPOSABLE) ×6 IMPLANT
TRENDGUARD 450 HYBRID PRO PACK (MISCELLANEOUS) ×3
TRENDGUARD 600 HYBRID PROC PK (MISCELLANEOUS)
TROCAR OPTI TIP 5M 100M (ENDOMECHANICALS) ×5 IMPLANT
TROCAR XCEL DIL TIP R 11M (ENDOMECHANICALS) IMPLANT
WARMER LAPAROSCOPE (MISCELLANEOUS) ×3 IMPLANT

## 2019-05-20 NOTE — Transfer of Care (Signed)
Immediate Anesthesia Transfer of Care Note  Patient: YAZMYN GOELLNER  Procedure(s) Performed: ATTEMPTED MINERVA  ABLATION (N/A ) DILATATION AND CURETTAGE (N/A ) LAPAROSCOPIC BILATERAL SALPINGECTOMY (N/A )  Patient Location: PACU  Anesthesia Type:General  Level of Consciousness: awake, alert , oriented and patient cooperative  Airway & Oxygen Therapy: Patient Spontanous Breathing and Patient connected to face mask oxygen  Post-op Assessment: Report given to RN, Post -op Vital signs reviewed and stable and Patient moving all extremities  Post vital signs: Reviewed and stable  Last Vitals:  Vitals Value Taken Time  BP 101/53 05/20/19 1153  Temp    Pulse 86 05/20/19 1154  Resp 18 05/20/19 1154  SpO2 100 % 05/20/19 1154  Vitals shown include unvalidated device data.  Last Pain:  Vitals:   05/20/19 0830  TempSrc: Oral  PainSc: 0-No pain         Complications: No apparent anesthesia complications

## 2019-05-20 NOTE — Anesthesia Postprocedure Evaluation (Signed)
Anesthesia Post Note  Patient: NIANI HARPRING  Procedure(s) Performed: ATTEMPTED MINERVA  ABLATION (N/A ) DILATATION AND CURETTAGE (N/A ) LAPAROSCOPIC BILATERAL SALPINGECTOMY (N/A )     Patient location during evaluation: PACU Anesthesia Type: General Level of consciousness: awake and alert Pain management: pain level controlled Vital Signs Assessment: post-procedure vital signs reviewed and stable Respiratory status: spontaneous breathing, nonlabored ventilation, respiratory function stable and patient connected to nasal cannula oxygen Cardiovascular status: blood pressure returned to baseline and stable Postop Assessment: no apparent nausea or vomiting Anesthetic complications: no    Last Vitals:  Vitals:   05/20/19 1249 05/20/19 1256  BP:  99/70  Pulse: 62 80  Resp: 14 16  Temp:    SpO2: 96% 95%    Last Pain:  Vitals:   05/20/19 1256  TempSrc:   PainSc: 0-No pain                 Barnet Glasgow

## 2019-05-20 NOTE — H&P (Signed)
  33 yo married P2 (11 yo and 21 yo kids) here today for a surgical consultation. She has had DUB since delivery of last baby. She has been taking OCPs (Yaz currently) and is still having DUB. She may bleed for 2-3 weeks or she may have a 6 day period with intermenstrual bleeding. She had a normal u/s and TSH. HBG was 12.5.She does not want any more kids and would like her oviducts removed. She understands that this is permanent.     Patient's last menstrual period was 04/26/2019 (exact date).    Past Medical History:  Diagnosis Date  . Allergy   . Asthma   . Family history of breast cancer   . SVD (spontaneous vaginal delivery) 06/26/2014    Past Surgical History:  Procedure Laterality Date  . WISDOM TOOTH EXTRACTION      Family History  Problem Relation Age of Onset  . Breast cancer Maternal Grandmother        Dx 4s; deceased 71s  . Diabetes Maternal Grandmother   . Cancer Maternal Grandmother   . Breast cancer Paternal Grandmother        Dx 38s; deceased 91s  . Diabetes Paternal Grandmother   . Cancer Paternal Grandmother   . Ovarian cancer Mother   . Diabetes Other        both parents  . Diabetes Maternal Grandfather   . Breast cancer Other        pat grandmother's mother  . Diabetes Paternal Grandfather     Social History:  reports that she has never smoked. She has never used smokeless tobacco. She reports current alcohol use. She reports that she does not use drugs.  Allergies: No Known Allergies  Medications Prior to Admission  Medication Sig Dispense Refill Last Dose  . acetaminophen (TYLENOL) 325 MG tablet Take 650 mg by mouth every 6 (six) hours as needed for headache.   Past Month at Unknown time  . cetirizine (ZYRTEC) 10 MG tablet Take 10 mg by mouth daily.   05/19/2019 at Unknown time  . drospirenone-ethinyl estradiol (YAZ,GIANVI,LORYNA) 3-0.02 MG tablet Take 1 tablet by mouth daily. 1 Package 11 05/17/2019 at Unknown time  . phentermine 15 MG capsule Take 1  capsule (15 mg total) by mouth every morning. 30 capsule 0 05/11/2019 at Unknown time  . topiramate (TOPAMAX) 50 MG tablet Take 1 tablet (50 mg total) by mouth 2 (two) times daily. 180 tablet 0 05/11/2019 at Unknown time    ROS  Blood pressure 129/71, pulse 88, temperature 97.9 F (36.6 C), temperature source Oral, resp. rate 20, height 5\' 6"  (1.676 m), weight 73.4 kg, last menstrual period 04/26/2019, SpO2 100 %, not currently breastfeeding. Physical Exam  Breathing, conversing, and ambulating normally Well nourished, well hydrated White female, no apparent distress Heart- rrr Lungs- CTAB Abd- benign  No results found for this or any previous visit (from the past 24 hour(s)).  No results found.  Assessment/Plan: Unwanted fertility- plan for laparoscopic removal of her oviducts DUB- plan for d&c and Minerva ablation  She understands the risks of surgery, including, but not to infection, bleeding, DVTs, damage to bowel, bladder, ureters. She wishes to proceed.     Emily Filbert 05/20/2019, 9:58 AM

## 2019-05-20 NOTE — Op Note (Signed)
05/20/2019  11:36 AM  PATIENT:  Rachael [name]  33 y.o. female  PRE-OPERATIVE DIAGNOSIS:  AUB Undesired Fertility  POST-OPERATIVE DIAGNOSIS:  AUB Undesired Fertility, uterine perforation  PROCEDURE:  Laparoscopic bilateral salpingectomy, dilation and curettage, attempted Minerva endometrial ablation SURGEON:  Surgeon(s) and Role:    * Tarrin Menn, Wilhemina Cash, MD - Primary   ANESTHESIA:   local and general  EBL:  50 mL   BLOOD ADMINISTERED:none  DRAINS: Robinson catheter   LOCAL MEDICATIONS USED:  MARCAINE     SPECIMEN:  Source of Specimen:  endometrial curettings  DISPOSITION OF SPECIMEN:  PATHOLOGY  COUNTS:  YES  TOURNIQUET:  * No tourniquets in log *  DICTATION: .Dragon Dictation  PLAN OF CARE: Discharge to home after PACU  PATIENT DISPOSITION:  PACU - hemodynamically stable.   Delay start of Pharmacological VTE agent (>24hrs) due to surgical blood loss or risk of bleeding: not applicable  The risks, benefits, and alternatives of surgery were explained, understood, accepted. She is certain that she wants permanent sterility. She understands that this is not reversible. She understands there is a small failure rate of this procedure. She also would like to have both oviducts removed her due newest recommendations to help prevent ovarian/peritoneal cancer. In the operating room she was placed in the dorsal lithotomy position, and general anesthesia was given without complication. Her abdomen and vagina were prepped and draped in the usual sterile fashion. A timeout procedure was done. A bimanual exam revealed a small anteverted and mobile uterus. Her adnexa felt normal. A Hulka manipulator was placed. Her bladder was emptied with a Robinson catheter. Gloves were changed, and attention was turned to the abdomen. Approximately the 10 mL of 0.5% Marcaine was injected into the umbilicus. A vertical incision was made at the site. A varies needle was placed intraperitoneally. Low-flow CO2 was  used to insufflate the abdomen to approximately 3-1/2 L. Once a good pneumoperitoneum was established, a 5 mm Excel trocar was placed. Laparoscopy confirmed correct placement. A 5 mm port was placed in the left lower quadrant and a 5 mm port in the right lower quadrant under direct laparoscopic visualization after injecting 0.5% Marcaine in the incision sites. Her pelvis and upper abdomen appeared normal. A Harmonic scapel was used to hemostatically removed both oviducts. I removed the oviducts through the 5 mm port.The CO2 was allowed to escape from the abdomen.  I removed the ports and noted hemostasis. A subcuticular closure was done with 4-0 Vicryl suture at all incision sites. I then proceeded with the dilation and curettage. A speculum was placed and a single-tooth tenaculum was used to grasp the anterior lip of her cervix. A total of 30 mL of 0.5% Marcaine was used to perform a paracervical block. Her uterus sounded to 7 cm and her cervical length was 3 cm. Her cervix was carefully and slowly dilated to accommodate a small curette. A curettage was done in all quadrants and the fundus of the uterus. A small amount of polypoid-type  tissue was obtained.  I then proceeded with the placement of the Minerva after dilating the cervix to accomodate the Minerva. The device was placed but would not pass its safety test. A seal could not be obtained. I made the presumption that there was a perforation. I then repeated the laparoscopy and saw a hemostatic perforation at the fundus. There was no active bleeding. I placed Arista on the fundus. The CO2 was again allowed to leave the abdomen. The skin in  the umbilical port was closed with 4-0 vicryl. There was no bleeding noted at the end of the case. She was taken to the recovery room after being extubated. She tolerated the procedure well. She was extubated and taken to the recovery room in stable condition.

## 2019-05-20 NOTE — Anesthesia Procedure Notes (Signed)
Procedure Name: Intubation Date/Time: 05/20/2019 10:26 AM Performed by: Myna Bright, CRNA Pre-anesthesia Checklist: Patient identified, Emergency Drugs available, Suction available and Patient being monitored Patient Re-evaluated:Patient Re-evaluated prior to induction Oxygen Delivery Method: Circle system utilized Preoxygenation: Pre-oxygenation with 100% oxygen Induction Type: IV induction Ventilation: Mask ventilation without difficulty Laryngoscope Size: Mac and 3 Grade View: Grade I Tube type: Oral Tube size: 7.0 mm Number of attempts: 1 Airway Equipment and Method: Stylet Placement Confirmation: ETT inserted through vocal cords under direct vision,  positive ETCO2 and breath sounds checked- equal and bilateral Secured at: 20 cm Tube secured with: Tape Dental Injury: Teeth and Oropharynx as per pre-operative assessment

## 2019-05-20 NOTE — Discharge Instructions (Signed)
** No Tylenol before 2:45pm  Dilation and Curettage or Vacuum Curettage, Care After This sheet gives you information about how to care for yourself after your procedure. Your health care provider may also give you more specific instructions. If you have problems or questions, contact your health care provider. What can I expect after the procedure? After your procedure, it is common to have:  Mild pain or cramping.  Some vaginal bleeding or spotting. These may last for up to 2 weeks after your procedure. Follow these instructions at home: Activity   Do not drive or use heavy machinery while taking prescription pain medicine.  Avoid driving for the first 24 hours after your procedure.  Take frequent, short walks, followed by rest periods, throughout the day. Ask your health care provider what activities are safe for you. After 1-2 days, you may be able to return to your normal activities.  Do not lift anything heavier than 10 lb (4.5 kg) until your health care provider approves.  For at least 2 weeks, or as long as told by your health care provider, do not: ? Douche. ? Use tampons. ? Have sexual intercourse. General instructions   Take over-the-counter and prescription medicines only as told by your health care provider. This is especially important if you take blood thinning medicine.  Do not take baths, swim, or use a hot tub until your health care provider approves. Take showers instead of baths.  Wear compression stockings as told by your health care provider. These stockings help to prevent blood clots and reduce swelling in your legs.  It is your responsibility to get the results of your procedure. Ask your health care provider, or the department performing the procedure, when your results will be ready.  Keep all follow-up visits as told by your health care provider. This is important. Contact a health care provider if:  You have severe cramps that get worse or that do  not get better with medicine.  You have severe abdominal pain.  You cannot drink fluids without vomiting.  You develop pain in a different area of your pelvis.  You have bad-smelling vaginal discharge.  You have a rash. Get help right away if:  You have vaginal bleeding that soaks more than one sanitary pad in 1 hour, for 2 hours in a row.  You pass large blood clots from your vagina.  You have a fever that is above 100.64F (38.0C).  Your abdomen feels very tender or hard.  You have chest pain.  You have shortness of breath.  You cough up blood.  You feel dizzy or light-headed.  You faint.  You have pain in your neck or shoulder area. This information is not intended to replace advice given to you by your health care provider. Make sure you discuss any questions you have with your health care provider. Document Released: 08/17/2000 Document Revised: 08/02/2017 Document Reviewed: 03/22/2016 Elsevier Patient Education  [place]. Laparoscopic Tubal Ligation, Care After This sheet gives you information about how to care for yourself after your procedure. Your health care provider may also give you more specific instructions. If you have problems or questions, contact your health care provider. What can I expect after the procedure? After the procedure, it is common to have:  A sore throat.  Discomfort in your shoulder.  Mild discomfort or cramping in your abdomen.  Gas pains.  Pain or soreness in the area where the surgical incision was made.  A bloated feeling.  Tiredness.  Nausea.  Vomiting. Follow these instructions at home: Medicines  Take over-the-counter and prescription medicines only as told by your health care provider.  Do not take aspirin because it can cause bleeding.  Ask your health care provider if the medicine prescribed to you: ? Requires you to avoid driving or using heavy machinery. ? Can cause constipation. You may need to  take actions to prevent or treat constipation, such as:  Drink enough fluid to keep your urine pale yellow.  Take over-the-counter or prescription medicines.  Eat foods that are high in fiber, such as beans, whole grains, and fresh fruits and vegetables.  Limit foods that are high in fat and processed sugars, such as fried or sweet foods. Incision care      Follow instructions from your health care provider about how to take care of your incision. Make sure you: ? Wash your hands with soap and water before and after you change your bandage (dressing). If soap and water are not available, use hand sanitizer. ? Change your dressing as told by your health care provider. ? Leave stitches (sutures), skin glue, or adhesive strips in place. These skin closures may need to stay in place for 2 weeks or longer. If adhesive strip edges start to loosen and curl up, you may trim the loose edges. Do not remove adhesive strips completely unless your health care provider tells you to do that.  Check your incision area every day for signs of infection. Check for: ? Redness, swelling, or pain. ? Fluid or blood. ? Warmth. ? Pus or a bad smell. Activity  Rest as told by your health care provider.  Avoid sitting for a long time without moving. Get up to take short walks every 1-2 hours. This is important to improve blood flow and breathing. Ask for help if you feel weak or unsteady.  Return to your normal activities as told by your health care provider. Ask your health care provider what activities are safe for you. General instructions  Do not take baths, swim, or use a hot tub until your health care provider approves. Ask your health care provider if you may take showers. You may only be allowed to take sponge baths.  Have someone help you with your daily household tasks for the first few days.  Keep all follow-up visits as told by your health care provider. This is important. Contact a health  care provider if:  You have redness, swelling, or pain around your incision.  Your incision feels warm to the touch.  You have pus or a bad smell coming from your incision.  The edges of your incision break open after the sutures have been removed.  Your pain does not improve after 2-3 days.  You have a rash.  You repeatedly become dizzy or light-headed.  Your pain medicine is not helping. Get help right away if you:  Have a fever.  Faint.  Have increasing pain in your abdomen.  Have severe pain in one or both of your shoulders.  Have fluid or blood coming from your sutures or from your vagina.  Have shortness of breath or difficulty breathing.  Have chest pain or leg pain.  Have ongoing nausea, vomiting, or diarrhea. Summary  After the procedure, it is common to have mild discomfort or cramping in your abdomen.  Take over-the-counter and prescription medicines only as told by your health care provider.  Watch for symptoms that should prompt you to call your health  care provider.  Keep all follow-up visits as told by your health care provider. This is important. This information is not intended to replace advice given to you by your health care provider. Make sure you discuss any questions you have with your health care provider. Document Released: 03/09/2005 Document Revised: 07/15/2018 Document Reviewed: 07/15/2018 Elsevier Patient Education  [place] Instructions  Activity: Get plenty of rest for the remainder of the day. A responsible individual must stay with you for 24 hours following the procedure.  For the next 24 hours, DO NOT: -Drive a car -Paediatric nurse -Drink alcoholic beverages -Take any medication unless instructed by your physician -Make any legal decisions or sign important papers.  Meals: Start with liquid foods such as gelatin or soup. Progress to regular foods as tolerated. Avoid greasy, spicy,  heavy foods. If nausea and/or vomiting occur, drink only clear liquids until the nausea and/or vomiting subsides. Call your physician if vomiting continues.  Special Instructions/Symptoms: Your throat may feel dry or sore from the anesthesia or the breathing tube placed in your throat during surgery. If this causes discomfort, gargle with warm salt water. The discomfort should disappear within 24 hours.  If you had a scopolamine patch placed behind your ear for the management of post- operative nausea and/or vomiting:  1. The medication in the patch is effective for 72 hours, after which it should be removed.  Wrap patch in a tissue and discard in the trash. Wash hands thoroughly with soap and water. 2. You may remove the patch earlier than 72 hours if you experience unpleasant side effects which may include dry mouth, dizziness or visual disturbances. 3. Avoid touching the patch. Wash your hands with soap and water after contact with the patch.

## 2019-05-21 LAB — SURGICAL PATHOLOGY

## 2019-05-22 ENCOUNTER — Encounter (HOSPITAL_BASED_OUTPATIENT_CLINIC_OR_DEPARTMENT_OTHER): Payer: Self-pay | Admitting: Obstetrics & Gynecology

## 2019-06-22 ENCOUNTER — Encounter: Payer: Self-pay | Admitting: Obstetrics & Gynecology

## 2019-06-22 ENCOUNTER — Other Ambulatory Visit: Payer: Self-pay

## 2019-06-22 ENCOUNTER — Ambulatory Visit (INDEPENDENT_AMBULATORY_CARE_PROVIDER_SITE_OTHER): Payer: No Typology Code available for payment source | Admitting: Obstetrics & Gynecology

## 2019-06-22 VITALS — BP 111/81 | HR 77 | Wt 161.0 lb

## 2019-06-22 DIAGNOSIS — Z9889 Other specified postprocedural states: Secondary | ICD-10-CM

## 2019-06-22 DIAGNOSIS — Z0000 Encounter for general adult medical examination without abnormal findings: Secondary | ICD-10-CM | POA: Diagnosis not present

## 2019-06-22 NOTE — Progress Notes (Signed)
   Subjective:    Patient ID: Rachael [name], female    DOB: [birth year], 33 y.o.   MRN: [id number]  HPI 33 yo married P2 here for a post op visit. She had a laparoscopic bilateral salpingectomy, d&c, and attempted endometrial ablation (uterus was too small). She is having no problems. She has had one period since then and reports that it was normal, not heavy, and no more intermenstrual bleeding.   Review of Systems Her pathology was benign, endometriod type polyp    Objective:   Physical Exam Breathing, conversing, and ambulating normally Well nourished, well hydrated White female, no apparent distress Abd- benign, incisions- healed well      Assessment & Plan:  Post op state- doing well Preventative care- fasting labs today

## 2019-06-23 LAB — COMPREHENSIVE METABOLIC PANEL
AG Ratio: 1.5 (calc) (ref 1.0–2.5)
ALT: 70 U/L — ABNORMAL HIGH (ref 6–29)
AST: 29 U/L (ref 10–30)
Albumin: 4.1 g/dL (ref 3.6–5.1)
Alkaline phosphatase (APISO): 67 U/L (ref 31–125)
BUN: 12 mg/dL (ref 7–25)
CO2: 24 mmol/L (ref 20–32)
Calcium: 9.4 mg/dL (ref 8.6–10.2)
Chloride: 110 mmol/L (ref 98–110)
Creat: 0.67 mg/dL (ref 0.50–1.10)
Globulin: 2.8 g/dL (calc) (ref 1.9–3.7)
Glucose, Bld: 90 mg/dL (ref 65–99)
Potassium: 4.7 mmol/L (ref 3.5–5.3)
Sodium: 145 mmol/L (ref 135–146)
Total Bilirubin: 0.7 mg/dL (ref 0.2–1.2)
Total Protein: 6.9 g/dL (ref 6.1–8.1)

## 2019-06-23 LAB — CBC
HCT: 39.4 % (ref 35.0–45.0)
Hemoglobin: 13.2 g/dL (ref 11.7–15.5)
MCH: 29.2 pg (ref 27.0–33.0)
MCHC: 33.5 g/dL (ref 32.0–36.0)
MCV: 87.2 fL (ref 80.0–100.0)
MPV: 10.2 fL (ref 7.5–12.5)
Platelets: 279 10*3/uL (ref 140–400)
RBC: 4.52 10*6/uL (ref 3.80–5.10)
RDW: 12.3 % (ref 11.0–15.0)
WBC: 6 10*3/uL (ref 3.8–10.8)

## 2019-06-23 LAB — LIPID PANEL
Cholesterol: 126 mg/dL (ref <200)
HDL: 56 mg/dL (ref >=50)
LDL Cholesterol (Calc): 45 mg/dL (calc)
Non-HDL Cholesterol (Calc): 70 mg/dL (calc) (ref <130)
Total CHOL/HDL Ratio: 2.3 (calc) (ref <5.0)
Triglycerides: 170 mg/dL — ABNORMAL HIGH (ref <150)

## 2019-06-23 LAB — TSH: TSH: 1.52 mIU/L

## 2019-06-23 LAB — HEMOGLOBIN A1C
Hgb A1c MFr Bld: 5.1 % of total Hgb (ref <5.7)
Mean Plasma Glucose: 100 (calc)
eAG (mmol/L): 5.5 (calc)

## 2019-06-23 LAB — VITAMIN D 25 HYDROXY (VIT D DEFICIENCY, FRACTURES): Vit D, 25-Hydroxy: 21 ng/mL — ABNORMAL LOW (ref 30–100)

## 2019-06-25 ENCOUNTER — Other Ambulatory Visit: Payer: Self-pay | Admitting: Obstetrics & Gynecology

## 2019-06-25 MED ORDER — VITAMIN D (ERGOCALCIFEROL) 1.25 MG (50000 UNIT) PO CAPS: 50000.0000 [IU] | ORAL_CAPSULE | ORAL | 0 refills | Status: DC

## 2019-06-25 NOTE — Progress Notes (Signed)
Vitamin D prescribed for deficiency. Patient notified via [place].

## 2019-07-08 ENCOUNTER — Telehealth (INDEPENDENT_AMBULATORY_CARE_PROVIDER_SITE_OTHER): Payer: No Typology Code available for payment source | Admitting: Physician Assistant

## 2019-07-08 VITALS — Ht 66.0 in | Wt 154.0 lb

## 2019-07-08 DIAGNOSIS — T887XXA Unspecified adverse effect of drug or medicament, initial encounter: Secondary | ICD-10-CM | POA: Diagnosis not present

## 2019-07-08 DIAGNOSIS — R4789 Other speech disturbances: Secondary | ICD-10-CM | POA: Diagnosis not present

## 2019-07-08 DIAGNOSIS — R635 Abnormal weight gain: Secondary | ICD-10-CM

## 2019-07-08 NOTE — Progress Notes (Signed)
Has been off Topamax and phentermine 3-4 weeks Having word finding trouble Doesn't want to restart medications

## 2019-07-08 NOTE — Progress Notes (Signed)
Patient ID: Rachael [name], female   DOB: 1985-10-08, 33 y.o.   MRN: [id number] .Marland KitchenVirtual Visit via Video Note  I connected with [place] on 07/08/19 at  9:30 AM EST by a video enabled telemedicine application and verified that I am speaking with the correct person using two identifiers.  Location: Patient: work Provider: clinic   I discussed the limitations of evaluation and management by telemedicine and the availability of in person appointments. The patient expressed understanding and agreed to proceed.  History of Present Illness: Pt is a 33 yo female who has actively been trying to lose weight. She is doing Recruitment consultant. She started at 214 and 154 today. She has been using phentermine and topamax. She stopped 5 weeks ago for a surgery for ablation. She has not restarted. She has noticed since she stopped she is having a lot of word finding difficultly. She denies any memory loss, vision troubles, headaches, problems concentrating, comprehension. She denies any extremity weakness. No speech changes. Her vitamin D was low at last recheck in October.    .. Active Ambulatory Problems    Diagnosis Date Noted  . Eustachian tube dysfunction 06/30/2014  . Family history of breast cancer   . Genetic testing 02/22/2015  . Family hx of ovarian malignancy 04/03/2016  . Supervision of other normal pregnancy, antepartum 09/18/2017  . Pyelectasis of fetus on prenatal ultrasound 12/05/2017  . Skin mole 01/12/2018  . Suspicious nevus 01/12/2018  . Word finding difficulty 07/10/2019  . Medication side effect 07/10/2019   Resolved Ambulatory Problems    Diagnosis Date Noted  . Allergy-induced asthma 06/30/2009  . Allergic rhinitis 10/11/2011  . Normal first pregnancy confirmed, currently in third trimester 06/25/2014  . SVD (spontaneous vaginal delivery) 06/26/2014  . Pain in left ear 06/30/2014  . Overweight (BMI 25.0-29.9) 04/03/2016  . Normal labor 04/26/2018  . SVD (spontaneous vaginal  delivery) 04/26/2018  . Class 1 obesity due to excess calories without serious comorbidity with body mass index (BMI) of 30.0 to 30.9 in adult 10/06/2018   Past Medical History:  Diagnosis Date  . Allergy   . Asthma    Reviewed med, allergy, problem list.   Observations/Objective: No acute distress. Normal mood and appearance.  Normal breathing.   .. Today's Vitals   07/08/19 0844  Weight: 154 lb (69.9 kg)  Height: 5\' 6"  (1.676 m)   Body mass index is 24.86 kg/m.     Assessment and Plan: Marland KitchenMarland KitchenAltheria was seen today for weight check.  Diagnoses and all orders for this visit:  Abnormal weight gain  Word finding difficulty  Medication side effect   Unclear exact etiology. No red flags associated. Vitamin D is low start D replacement with 2000units daily and b12 1065mcg daily. Reviewed other labs done by GYN and looked great. 5 weeks is a longer time but topamax and phentermine coming off could cause some mental clarity changes. Lets give it a few more weeks to adjust to being with out medication. Call with any other symptoms.   Weight is great. You are up 3lbs since stopping medication. Continue to stay active and keep to the caloric goals. Way to GO. In normal BMI.    Follow Up Instructions:    I discussed the assessment and treatment plan with the patient. The patient was provided an opportunity to ask questions and all were answered. The patient agreed with the plan and demonstrated an understanding of the instructions.   The patient was advised to call  back or seek an in-person evaluation if the symptoms worsen or if the condition fails to improve as anticipated.     Iran [name], PA-C

## 2019-07-10 ENCOUNTER — Encounter: Payer: Self-pay | Admitting: Physician Assistant

## 2019-07-10 DIAGNOSIS — T887XXA Unspecified adverse effect of drug or medicament, initial encounter: Secondary | ICD-10-CM | POA: Insufficient documentation

## 2019-07-10 DIAGNOSIS — R4789 Other speech disturbances: Secondary | ICD-10-CM | POA: Insufficient documentation

## 2019-08-10 ENCOUNTER — Telehealth: Payer: Self-pay | Admitting: *Deleted

## 2019-08-10 NOTE — Telephone Encounter (Signed)
Pt called and is wishing to go back on Yaz because she was unable to get the uterine ablation.  Spoke with Dr Hulan Fray who agreed going back on Yaz may help with her irregular periods.

## 2019-10-07 ENCOUNTER — Encounter: Payer: Self-pay | Admitting: Physician Assistant

## 2019-10-07 ENCOUNTER — Telehealth (INDEPENDENT_AMBULATORY_CARE_PROVIDER_SITE_OTHER): Payer: No Typology Code available for payment source | Admitting: Physician Assistant

## 2019-10-07 VITALS — Temp 98.7°F | Ht 66.0 in | Wt 165.0 lb

## 2019-10-07 DIAGNOSIS — J028 Acute pharyngitis due to other specified organisms: Secondary | ICD-10-CM | POA: Diagnosis not present

## 2019-10-07 DIAGNOSIS — Z20818 Contact with and (suspected) exposure to other bacterial communicable diseases: Secondary | ICD-10-CM | POA: Diagnosis not present

## 2019-10-07 MED ORDER — PENICILLIN V POTASSIUM 500 MG PO TABS: 500.0000 mg | ORAL_TABLET | Freq: Two times a day (BID) | ORAL | 0 refills | Status: DC

## 2019-10-07 NOTE — Progress Notes (Signed)
Patient ID: Rachael [name], female   DOB: Oct 09, 1985, 34 y.o.   MRN: [id number] .Marland KitchenVirtual Visit via Video Note  I connected with [place] on 10/07/19 at  9:10 AM EST by a video enabled telemedicine application and verified that I am speaking with the correct person using two identifiers.  Location: Patient: home Provider: clinic   I discussed the limitations of evaluation and management by telemedicine and the availability of in person appointments. The patient expressed understanding and agreed to proceed.  History of Present Illness: Pt is a 34 yo female who calls into the clinic with sore throat and ear pain since yesterday. Her son and mother were both dx with strep on Monday. She denies any fever, headache, cough, SOB, loss of smell or taste, GI symptoms. Both mother and son were negative for covid. She does feel like having chills, her throat is red and swollen, hard to eat and feels like neck is swollen. Taking tylenol with some relief.   .. Active Ambulatory Problems    Diagnosis Date Noted  . Eustachian tube dysfunction 06/30/2014  . Family history of breast cancer   . Genetic testing 02/22/2015  . Family hx of ovarian malignancy 04/03/2016  . Supervision of other normal pregnancy, antepartum 09/18/2017  . Pyelectasis of fetus on prenatal ultrasound 12/05/2017  . Skin mole 01/12/2018  . Suspicious nevus 01/12/2018  . Word finding difficulty 07/10/2019  . Medication side effect 07/10/2019   Resolved Ambulatory Problems    Diagnosis Date Noted  . Allergy-induced asthma 06/30/2009  . Allergic rhinitis 10/11/2011  . Normal first pregnancy confirmed, currently in third trimester 06/25/2014  . SVD (spontaneous vaginal delivery) 06/26/2014  . Pain in left ear 06/30/2014  . Overweight (BMI 25.0-29.9) 04/03/2016  . Normal labor 04/26/2018  . SVD (spontaneous vaginal delivery) 04/26/2018  . Class 1 obesity due to excess calories without serious comorbidity with body mass index  (BMI) of 30.0 to 30.9 in adult 10/06/2018   Past Medical History:  Diagnosis Date  . Allergy   . Asthma    Reviewed med, allergy problem list.     Observations/Objective: No acute distress.  Normal breathing.  No cough.  Appears fatigued and pale.  Not able to visualize throat.   .. Today's Vitals   10/07/19 0853  Temp: 98.7 F (37.1 C)  TempSrc: Oral  Weight: 165 lb (74.8 kg)  Height: 5\' 6"  (1.676 m)   Body mass index is 26.63 kg/m.    Assessment and Plan: Marland KitchenMarland KitchenGailya was seen today for sore throat.  Diagnoses and all orders for this visit:  Acute pharyngitis due to other specified organisms -     penicillin v potassium (VEETID) 500 MG tablet; Take 1 tablet (500 mg total) by mouth 2 (two) times daily. For 10 days.  Exposure to strep throat -     penicillin v potassium (VEETID) 500 MG tablet; Take 1 tablet (500 mg total) by mouth 2 (two) times daily. For 10 days.   Pt has positive exposure. No typical covid symptoms. Classic strep symptoms. Treated with PCN for 10 days. Discussed symptomatic care. Follow up as needed or if symptoms change or worsen.    Follow Up Instructions:    I discussed the assessment and treatment plan with the patient. The patient was provided an opportunity to ask questions and all were answered. The patient agreed with the plan and demonstrated an understanding of the instructions.   The patient was advised to call back or seek  an in-person evaluation if the symptoms worsen or if the condition fails to improve as anticipated.    Iran [name], PA-C

## 2019-10-07 NOTE — Progress Notes (Signed)
Son has strep. Tested positive Monday.   Denies fever, pain in throat, feels raw, no cough. SX started last night

## 2019-10-23 ENCOUNTER — Telehealth (INDEPENDENT_AMBULATORY_CARE_PROVIDER_SITE_OTHER): Payer: No Typology Code available for payment source | Admitting: Physician Assistant

## 2019-10-23 ENCOUNTER — Encounter: Payer: Self-pay | Admitting: Physician Assistant

## 2019-10-23 VITALS — BP 111/77 | HR 86 | Temp 98.1°F | Ht 66.0 in | Wt 165.0 lb

## 2019-10-23 DIAGNOSIS — Z20822 Contact with and (suspected) exposure to covid-19: Secondary | ICD-10-CM

## 2019-10-23 DIAGNOSIS — J040 Acute laryngitis: Secondary | ICD-10-CM | POA: Diagnosis not present

## 2019-10-23 DIAGNOSIS — J028 Acute pharyngitis due to other specified organisms: Secondary | ICD-10-CM

## 2019-10-23 MED ORDER — PREDNISONE 20 MG PO TABS: ORAL_TABLET | ORAL | 0 refills | Status: DC

## 2019-10-23 MED ORDER — AMOXICILLIN 500 MG PO CAPS: 500.0000 mg | ORAL_CAPSULE | Freq: Two times a day (BID) | ORAL | 0 refills | Status: DC

## 2019-10-23 NOTE — Progress Notes (Signed)
Patient ID: Rachael [name], female   DOB: 07-Jun-1986, 34 y.o.   MRN: [id number] .Rachael KitchenVirtual Visit via Telephone Note  I connected with Rachael [name] on 10/23/19 at 11:30 AM EST by telephone and verified that I am speaking with the correct person using two identifiers.  Location: Patient: home Provider: clinic   I discussed the limitations, risks, security and privacy concerns of performing an evaluation and management service by telephone and the availability of in person appointments. I also discussed with the patient that there may be a patient responsible charge related to this service. The patient expressed understanding and agreed to proceed.   History of Present Illness: Pt is a 34 yo female with hx of recurrent ST in winter due to her son who has "strep all the time". Son is at doctors today. Her ST started last night. She was just seen on 2/3 and treated for strep. She got better. She finished all abx. She lost of voice as well. Throat pain is 7/10. Tonsils are red and swollen. No fever, chills. No diarrhea or loss of smell or taste. No SOB or cough. Tylenol helps some.   .. Active Ambulatory Problems    Diagnosis Date Noted  . Eustachian tube dysfunction 06/30/2014  . Family history of breast cancer   . Genetic testing 02/22/2015  . Family hx of ovarian malignancy 04/03/2016  . Supervision of other normal pregnancy, antepartum 09/18/2017  . Pyelectasis of fetus on prenatal ultrasound 12/05/2017  . Skin mole 01/12/2018  . Suspicious nevus 01/12/2018  . Word finding difficulty 07/10/2019  . Medication side effect 07/10/2019   Resolved Ambulatory Problems    Diagnosis Date Noted  . Allergy-induced asthma 06/30/2009  . Allergic rhinitis 10/11/2011  . Normal first pregnancy confirmed, currently in third trimester 06/25/2014  . SVD (spontaneous vaginal delivery) 06/26/2014  . Pain in left ear 06/30/2014  . Overweight (BMI 25.0-29.9) 04/03/2016  . Normal labor 04/26/2018  . SVD  (spontaneous vaginal delivery) 04/26/2018  . Class 1 obesity due to excess calories without serious comorbidity with body mass index (BMI) of 30.0 to 30.9 in adult 10/06/2018   Past Medical History:  Diagnosis Date  . Allergy   . Asthma    Reviewed med, allergy, problem list.     Observations/Objective: No acute distress. No labored breathing or cough.  Not able to see throat.   Per patient no exudate.  Left swollen tender lymph nodes.  Swollen red tonsils.   .. Today's Vitals   10/23/19 1118  BP: 111/77  Pulse: 86  Temp: 98.1 F (36.7 C)  TempSrc: Oral  Weight: 165 lb (74.8 kg)  Height: 5\' 6"  (1.676 m)   Body mass index is 26.63 kg/m.    Assessment and Plan: Rachael KitchenMarland KitchenKailanni was seen today for sore throat.  Diagnoses and all orders for this visit:  Acute pharyngitis due to other specified organisms -     predniSONE (DELTASONE) 20 MG tablet; Take one tablet twice a day for 5 days. -     amoxicillin (AMOXIL) 500 MG capsule; Take 1 capsule (500 mg total) by mouth 2 (two) times daily. For 10 days.  Suspected COVID-19 virus infection  Laryngitis -     predniSONE (DELTASONE) 20 MG tablet; Take one tablet twice a day for 5 days. -     amoxicillin (AMOXIL) 500 MG capsule; Take 1 capsule (500 mg total) by mouth 2 (two) times daily. For 10 days.  hx of recurrent sore throat.   Possible  strep exposure so getting tested today. If positive start amoxil and prednisone. If negative start prednisone and treat symptomatically until Sunday. If still having symptoms start amoxicillin. Due to work in public need to confirm no covid. Self isolate and drive through testing to be done today. Encouraged tylenol/ibuprofen, gargle with salt water, rest.   Consider getting son to ENT for tonsillectomy.    Follow Up Instructions:    I discussed the assessment and treatment plan with the patient. The patient was provided an opportunity to ask questions and all were answered. The patient  agreed with the plan and demonstrated an understanding of the instructions.   The patient was advised to call back or seek an in-person evaluation if the symptoms worsen or if the condition fails to improve as anticipated.  I provided 13 minutes of non-face-to-face time during this encounter.   Iran [name], PA-C

## 2019-10-23 NOTE — Progress Notes (Signed)
Started last night: Sore throat Loss of voice  NO congestion/headache/GI problems No fever/sweats/chills/body aches  Has taking Tylenol OTC, no other meds

## 2019-11-02 ENCOUNTER — Encounter: Payer: Self-pay | Admitting: Physician Assistant

## 2019-11-02 DIAGNOSIS — M5440 Lumbago with sciatica, unspecified side: Secondary | ICD-10-CM | POA: Insufficient documentation

## 2019-11-02 NOTE — Telephone Encounter (Signed)
Pended, please send if OK  

## 2019-11-10 ENCOUNTER — Encounter: Payer: Self-pay | Admitting: Physician Assistant

## 2019-11-11 MED ORDER — PHENTERMINE HCL 37.5 MG PO TABS: 37.5000 mg | ORAL_TABLET | Freq: Every day | ORAL | 0 refills | Status: DC

## 2019-11-30 ENCOUNTER — Other Ambulatory Visit: Payer: Self-pay

## 2019-11-30 ENCOUNTER — Ambulatory Visit (INDEPENDENT_AMBULATORY_CARE_PROVIDER_SITE_OTHER): Payer: No Typology Code available for payment source | Admitting: Obstetrics & Gynecology

## 2019-11-30 ENCOUNTER — Encounter: Payer: Self-pay | Admitting: Obstetrics & Gynecology

## 2019-11-30 VITALS — BP 126/83 | HR 104 | Resp 16 | Ht 65.0 in | Wt 168.0 lb

## 2019-11-30 DIAGNOSIS — E559 Vitamin D deficiency, unspecified: Secondary | ICD-10-CM

## 2019-11-30 MED ORDER — ETONOGESTREL-ETHINYL ESTRADIOL 0.12-0.015 MG/24HR VA RING: VAGINAL_RING | VAGINAL | 12 refills | Status: DC

## 2019-11-30 NOTE — Progress Notes (Signed)
   Subjective:    Patient ID: Rachael [name], female    DOB: [birth year], 34 y.o.   MRN: [id number]  HPI 34 yo married P2 here because she is still having AUB. She had a d&c (benign uterine polyps) and bilateral salpingectomy 9/20. I was unable to do an ablation due to the small size of her uterus. She has tried multiple OCPs with no help. Her u/s showed a very tiny (55 mL) uterus with no abnormalities.   Review of Systems Pap normal 9/18    Objective:   Physical Exam Breathing, conversing, and ambulating normally Well nourished, well hydrated White female, no apparent distress     Assessment & Plan:  Irregular bleeding- ablation failed attempt due to the size of her uterus. She has tried at least 3 different types of OCPs with no help So now she will try nuvaring (didn't want ortho evra) If no help, last resort would be a tvh  Preventative care- she will need another pap smear 9/21  H/o vit d deficiency s/p prescription treatment- recheck level today

## 2019-12-01 ENCOUNTER — Telehealth: Payer: Self-pay | Admitting: *Deleted

## 2019-12-01 ENCOUNTER — Encounter: Payer: Self-pay | Admitting: *Deleted

## 2019-12-01 LAB — VITAMIN D 25 HYDROXY (VIT D DEFICIENCY, FRACTURES): Vit D, 25-Hydroxy: 21 ng/mL — ABNORMAL LOW (ref 30–100)

## 2019-12-01 MED ORDER — VITAMIN D (ERGOCALCIFEROL) 1.25 MG (50000 UNIT) PO CAPS: 50000.0000 [IU] | ORAL_CAPSULE | ORAL | 0 refills | Status: DC

## 2019-12-01 NOTE — Telephone Encounter (Signed)
Pt Vitamin D level is 21, Vitamin D 50K sent to her pharmacy .  Pt notified to take 1 every week for 8 weeks

## 2019-12-16 ENCOUNTER — Telehealth (INDEPENDENT_AMBULATORY_CARE_PROVIDER_SITE_OTHER): Payer: No Typology Code available for payment source | Admitting: Physician Assistant

## 2019-12-16 VITALS — Ht 66.0 in | Wt 165.0 lb

## 2019-12-16 DIAGNOSIS — E663 Overweight: Secondary | ICD-10-CM

## 2019-12-16 DIAGNOSIS — Z6826 Body mass index (BMI) 26.0-26.9, adult: Secondary | ICD-10-CM | POA: Diagnosis not present

## 2019-12-16 MED ORDER — BUPROPION HCL ER (SR) 100 MG PO TB12: 100.0000 mg | ORAL_TABLET | Freq: Two times a day (BID) | ORAL | 1 refills | Status: DC

## 2019-12-16 MED ORDER — PHENTERMINE HCL 37.5 MG PO TABS: 37.5000 mg | ORAL_TABLET | Freq: Every day | ORAL | 0 refills | Status: DC

## 2019-12-16 NOTE — Progress Notes (Signed)
Patient ID: Rachael [name], female   DOB: 12/09/1985, 34 y.o.   MRN: [id number] .Marland KitchenVirtual Visit via Video Note  I connected with [place] on 4/414/2021 at  1:20 PM EDT by a video enabled telemedicine application and verified that I am speaking with the correct person using two identifiers.  Location: Patient: work Provider: clinic   I discussed the limitations of evaluation and management by telemedicine and the availability of in person appointments. The patient expressed understanding and agreed to proceed.  History of Present Illness: Pt is a 34 yo female who calls into the clinic to discuss weight. She had lost weight and then started to gain it back after going off phentermine. Phentermine was added but not having quite the same effect. She is keeping weight off and seems to be curbing appetitie but worried what will happen when she goes off it. She is staying "active" but not exercising. She is not on diet plan.    .. Active Ambulatory Problems    Diagnosis Date Noted  . Eustachian tube dysfunction 06/30/2014  . Family history of breast cancer   . Genetic testing 02/22/2015  . Family hx of ovarian malignancy 04/03/2016  . Supervision of other normal pregnancy, antepartum 09/18/2017  . Skin mole 01/12/2018  . Suspicious nevus 01/12/2018  . Word finding difficulty 07/10/2019  . Medication side effect 07/10/2019  . Acute bilateral low back pain with sciatica 11/02/2019   Resolved Ambulatory Problems    Diagnosis Date Noted  . Allergy-induced asthma 06/30/2009  . Allergic rhinitis 10/11/2011  . Normal first pregnancy confirmed, currently in third trimester 06/25/2014  . SVD (spontaneous vaginal delivery) 06/26/2014  . Pain in left ear 06/30/2014  . Overweight (BMI 25.0-29.9) 04/03/2016  . Pyelectasis of fetus on prenatal ultrasound 12/05/2017  . Normal labor 04/26/2018  . SVD (spontaneous vaginal delivery) 04/26/2018  . Class 1 obesity due to excess calories without  serious comorbidity with body mass index (BMI) of 30.0 to 30.9 in adult 10/06/2018   Past Medical History:  Diagnosis Date  . Allergy   . Asthma    Reviewed med, allergy, problem list.   Observations/Objective: No acute distress.  Normal mood and appearance.   .. Today's Vitals   12/16/19 1317  Weight: 165 lb (74.8 kg)  Height: 5\' 6"  (1.676 m)   Body mass index is 26.63 kg/m.    Assessment and Plan: Marland KitchenMarland KitchenDiagnoses and all orders for this visit:  Overweight (BMI 25.0-29.9) -     buPROPion (WELLBUTRIN SR) 100 MG 12 hr tablet; Take 1 tablet (100 mg total) by mouth 2 (two) times daily. -     phentermine (ADIPEX-P) 37.5 MG tablet; Take 1 tablet (37.5 mg total) by mouth daily before breakfast.   Per pt wants to lose 10 more lbs and keep off. She does not qualify for weight loss medications due to BMI of 26. Will add wellbutrin and wean off phentermine over next 1-2 months. Discussed side effects of medication. Consider IF 16:8.  Follow up in 2 months.    Follow Up Instructions:    I discussed the assessment and treatment plan with the patient. The patient was provided an opportunity to ask questions and all were answered. The patient agreed with the plan and demonstrated an understanding of the instructions.   The patient was advised to call back or seek an in-person evaluation if the symptoms worsen or if the condition fails to improve as anticipated.  I provided 12 minutes of non-face-to-face  time during this encounter.   Iran [name], PA-C

## 2019-12-18 ENCOUNTER — Encounter: Payer: Self-pay | Admitting: Physician Assistant

## 2020-01-05 ENCOUNTER — Encounter: Payer: Self-pay | Admitting: Physician Assistant

## 2020-01-05 ENCOUNTER — Other Ambulatory Visit: Payer: Self-pay | Admitting: Nurse Practitioner

## 2020-01-05 DIAGNOSIS — E663 Overweight: Secondary | ICD-10-CM

## 2020-01-05 MED ORDER — BUPROPION HCL ER (SR) 100 MG PO TB12: 100.0000 mg | ORAL_TABLET | Freq: Every day | ORAL | 1 refills | Status: DC

## 2020-01-05 NOTE — Progress Notes (Unsigned)
Patient reported decreasing Wellbutrin dose to 100 mg once per day due to difficulty sleeping with the twice a day dosing. Chart updated.

## 2020-01-26 ENCOUNTER — Other Ambulatory Visit: Payer: Self-pay | Admitting: Physician Assistant

## 2020-01-26 DIAGNOSIS — E663 Overweight: Secondary | ICD-10-CM

## 2020-01-26 NOTE — Telephone Encounter (Signed)
Last appt 12/16/2019 Last filled 12/16/2019 #30 no RF

## 2020-01-27 NOTE — Telephone Encounter (Signed)
See note. Can you sign denial?

## 2020-01-27 NOTE — Telephone Encounter (Signed)
Need weight and BP check. Could be virtual.

## 2020-01-27 NOTE — Telephone Encounter (Signed)
Patient states that she does not need this medication anymore.

## 2020-01-27 NOTE — Telephone Encounter (Signed)
Please schedule appt for patient.

## 2020-03-02 ENCOUNTER — Ambulatory Visit (INDEPENDENT_AMBULATORY_CARE_PROVIDER_SITE_OTHER): Payer: No Typology Code available for payment source | Admitting: Physician Assistant

## 2020-03-02 ENCOUNTER — Other Ambulatory Visit: Payer: Self-pay

## 2020-03-02 ENCOUNTER — Encounter: Payer: Self-pay | Admitting: Physician Assistant

## 2020-03-02 VITALS — BP 122/78 | HR 77 | Ht 66.0 in | Wt 172.0 lb

## 2020-03-02 DIAGNOSIS — E781 Pure hyperglyceridemia: Secondary | ICD-10-CM | POA: Insufficient documentation

## 2020-03-02 DIAGNOSIS — E663 Overweight: Secondary | ICD-10-CM

## 2020-03-02 DIAGNOSIS — E559 Vitamin D deficiency, unspecified: Secondary | ICD-10-CM

## 2020-03-02 DIAGNOSIS — Z0000 Encounter for general adult medical examination without abnormal findings: Secondary | ICD-10-CM | POA: Diagnosis not present

## 2020-03-02 DIAGNOSIS — Z131 Encounter for screening for diabetes mellitus: Secondary | ICD-10-CM

## 2020-03-02 LAB — COMPLETE METABOLIC PANEL WITH GFR
AG Ratio: 1.4 (calc) (ref 1.0–2.5)
ALT: 24 U/L (ref 6–29)
AST: 15 U/L (ref 10–30)
Albumin: 4.2 g/dL (ref 3.6–5.1)
Alkaline phosphatase (APISO): 39 U/L (ref 31–125)
BUN: 14 mg/dL (ref 7–25)
CO2: 26 mmol/L (ref 20–32)
Calcium: 9.4 mg/dL (ref 8.6–10.2)
Chloride: 106 mmol/L (ref 98–110)
Creat: 0.69 mg/dL (ref 0.50–1.10)
GFR, Est African American: 134 mL/min/{1.73_m2} (ref >=60)
GFR, Est Non African American: 115 mL/min/{1.73_m2} (ref >=60)
Globulin: 3.1 g/dL (calc) (ref 1.9–3.7)
Glucose, Bld: 79 mg/dL (ref 65–99)
Potassium: 4.2 mmol/L (ref 3.5–5.3)
Sodium: 138 mmol/L (ref 135–146)
Total Bilirubin: 0.5 mg/dL (ref 0.2–1.2)
Total Protein: 7.3 g/dL (ref 6.1–8.1)

## 2020-03-02 LAB — LIPID PANEL W/REFLEX DIRECT LDL
Cholesterol: 156 mg/dL (ref <200)
HDL: 68 mg/dL (ref >=50)
LDL Cholesterol (Calc): 62 mg/dL (calc)
Non-HDL Cholesterol (Calc): 88 mg/dL (calc) (ref <130)
Total CHOL/HDL Ratio: 2.3 (calc) (ref <5.0)
Triglycerides: 185 mg/dL — ABNORMAL HIGH (ref <150)

## 2020-03-02 LAB — VITAMIN D 25 HYDROXY (VIT D DEFICIENCY, FRACTURES): Vit D, 25-Hydroxy: 47 ng/mL (ref 30–100)

## 2020-03-02 MED ORDER — PHENTERMINE HCL 15 MG PO CAPS: 15.0000 mg | ORAL_CAPSULE | ORAL | 0 refills | Status: DC

## 2020-03-02 MED ORDER — TOPIRAMATE 25 MG PO TABS: 25.0000 mg | ORAL_TABLET | Freq: Two times a day (BID) | ORAL | 0 refills | Status: DC

## 2020-03-02 NOTE — Progress Notes (Signed)
Subjective:     Rachael [name] is a 34 y.o. female and is here for a comprehensive physical exam. The patient reports problems - she has started to gain some weight back and wonders what next steps are. .    Social History   Socioeconomic History   Marital status: Married    Spouse name: Ysidro Evert   Number of children: 0   Years of education: Not on file   Highest education level: Not on file  Occupational History   Occupation: [place]    Employer: [employer]  Tobacco Use   Smoking status: Never Smoker   Smokeless tobacco: Never Used  Scientific laboratory technician Use: Former  Substance and Sexual Activity   Alcohol use: Yes    Comment: social use   Drug use: No   Sexual activity: Yes    Birth control/protection: Pill  Other Topics Concern   Not on file  Social History Narrative   Lives with husband   1 dog   Expecting first child   Works at [employer])   Working on Museum/gallery exhibitions officer at [employer] Strain:    Difficulty of Paying Living Expenses:   Food Insecurity:    Worried About Charity fundraiser in the Last Year:    Arboriculturist in the Last Year:   Transportation Needs:    Film/video editor (Medical):    Lack of Transportation (Non-Medical):   Physical Activity:    Days of Exercise per Week:    Minutes of Exercise per Session:   Stress:    Feeling of Stress :   Social Connections:    Frequency of Communication with Friends and Family:    Frequency of Social Gatherings with Friends and Family:    Attends Religious Services:    Active Member of Clubs or Organizations:    Attends Music therapist:    Marital Status:   Intimate Partner Violence:    Fear of Current or Ex-Partner:    Emotionally Abused:    Physically Abused:    Sexually Abused:    Health Maintenance  Topic Date Due   COVID-19 Vaccine (1) 03/18/2020  (Originally 03/09/1998)   Hepatitis C Screening  03/02/2021 (Originally 1986/08/13)   INFLUENZA VACCINE  04/03/2020   PAP SMEAR-Modifier  05/22/2020   TETANUS/TDAP  02/06/2028   HIV Screening  Completed    The following portions of the patient's history were reviewed and updated as appropriate: allergies, current medications, past family history, past medical history, past social history, past surgical history and problem list.  Review of Systems A comprehensive review of systems was negative.   Objective:    BP 122/78    Pulse 77    Ht 5\' 6"  (1.676 m)    Wt 172 lb (78 kg)    SpO2 100%    BMI 27.76 kg/m  General appearance: alert, cooperative and appears stated age Head: Normocephalic, without obvious abnormality, atraumatic Eyes: conjunctivae/corneas clear. PERRL, EOM's intact. Fundi benign. Ears: normal TM's and external ear canals both ears Nose: Nares normal. Septum midline. Mucosa normal. No drainage or sinus tenderness. Throat: lips, mucosa, and tongue normal; teeth and gums normal Neck: no adenopathy, no carotid bruit, no JVD, supple, symmetrical, trachea midline and thyroid not enlarged, symmetric, no tenderness/mass/nodules Back: symmetric, no curvature. ROM normal. No CVA tenderness. Lungs: clear to auscultation bilaterally Heart: regular rate and rhythm, S1, S2 normal,  no murmur, click, rub or gallop Abdomen: soft, non-tender; bowel sounds normal; no masses,  no organomegaly Extremities: extremities normal, atraumatic, no cyanosis or edema Pulses: 2+ and symmetric Skin: Skin color, texture, turgor normal. No rashes or lesions Lymph nodes: Cervical, supraclavicular, and axillary nodes normal. Neurologic: Alert and oriented X 3, normal strength and tone. Normal symmetric reflexes. Normal coordination and gait   .[name] Depression screen [facility] 2/9 03/02/2020 10/03/2018 09/18/2018 01/10/2018 06/27/2017  Decreased Interest 0 0 0 0 0  Down, Depressed, Hopeless 0 0 0 0 0  PHQ - 2 Score  0 0 0 0 0  Altered sleeping 0 0 - 0 -  Tired, decreased energy 0 0 - 0 -  Change in appetite 0 0 - 0 -  Feeling bad or failure about yourself  0 0 - 0 -  Trouble concentrating 0 0 - 0 -  Moving slowly or fidgety/restless 0 0 - 0 -  Suicidal thoughts 0 0 - 0 -  PHQ-9 Score 0 0 - 0 -  Difficult doing work/chores Not difficult at all Not difficult at all - Not difficult at all -   .. GAD 7 : Generalized Anxiety Score 03/02/2020 10/03/2018 09/18/2018 01/10/2018  Nervous, Anxious, on Edge 0 0 0 0  Control/stop worrying 0 0 0 0  Worry too much - different things 0 0 0 0  Trouble relaxing 0 0 0 0  Restless 0 0 0 0  Easily annoyed or irritable 0 0 0 0  Afraid - awful might happen 0 0 0 0  Total GAD 7 Score 0 0 0 0  Anxiety Difficulty Not difficult at all Not difficult at all Not difficult at all Not difficult at all     Assessment:    Healthy female exam.      Plan:    Marland KitchenMarland KitchenTelisa was seen today for annual exam.  Diagnoses and all orders for this visit:  Routine physical examination -     VITAMIN D 25 Hydroxy (Vit-D Deficiency, Fractures) -     Lipid Panel w/reflex Direct LDL -     COMPLETE METABOLIC PANEL WITH GFR  Overweight (BMI 25.0-29.9) -     COMPLETE METABOLIC PANEL WITH GFR -     phentermine 15 MG capsule; Take 1 capsule (15 mg total) by mouth every morning. -     topiramate (TOPAMAX) 25 MG tablet; Take 1 tablet (25 mg total) by mouth 2 (two) times daily.  Vitamin D insufficiency -     VITAMIN D 25 Hydroxy (Vit-D Deficiency, Fractures)  High triglycerides -     Lipid Panel w/reflex Direct LDL  Screening for diabetes mellitus -     COMPLETE METABOLIC PANEL WITH GFR   .[name] Discussed 150 minutes of exercise a week.  Encouraged vitamin D 1000 units and Calcium 1300mg  or 4 servings of dairy a day.  Fasting labs ordered.  Pap up to date.   [name].Discussed low carb diet with 1500 calories and 80g of protein.  Exercising at least 150 minutes a week.  My Fitness Pal could be  a Microbiologist.  Start phentermine and topamax goal weight 150 and to maintain for 2 years. Misunderstanding before she only had a hard time coming off topamax but not while she was on it.  Follow up in 3 months.   See After Visit Summary for Counseling Recommendations

## 2020-03-02 NOTE — Patient Instructions (Signed)
Health Maintenance, Female Adopting a healthy lifestyle and getting preventive care are important in promoting health and wellness. Ask your health care provider about:  The right schedule for you to have regular tests and exams.  Things you can do on your own to prevent diseases and keep yourself healthy. What should I know about diet, weight, and exercise? Eat a healthy diet   Eat a diet that includes plenty of vegetables, fruits, low-fat dairy products, and lean protein.  Do not eat a lot of foods that are high in solid fats, added sugars, or sodium. Maintain a healthy weight Body mass index (BMI) is used to identify weight problems. It estimates body fat based on height and weight. Your health care provider can help determine your BMI and help you achieve or maintain a healthy weight. Get regular exercise Get regular exercise. This is one of the most important things you can do for your health. Most adults should:  Exercise for at least 150 minutes each week. The exercise should increase your heart rate and make you sweat (moderate-intensity exercise).  Do strengthening exercises at least twice a week. This is in addition to the moderate-intensity exercise.  Spend less time sitting. Even light physical activity can be beneficial. Watch cholesterol and blood lipids Have your blood tested for lipids and cholesterol at 33 years of age, then have this test every 5 years. Have your cholesterol levels checked more often if:  Your lipid or cholesterol levels are high.  You are older than 33 years of age.  You are at high risk for heart disease. What should I know about cancer screening? Depending on your health history and family history, you may need to have cancer screening at various ages. This may include screening for:  Breast cancer.  Cervical cancer.  Colorectal cancer.  Skin cancer.  Lung cancer. What should I know about heart disease, diabetes, and high blood  pressure? Blood pressure and heart disease  High blood pressure causes heart disease and increases the risk of stroke. This is more likely to develop in people who have high blood pressure readings, are of African descent, or are overweight.  Have your blood pressure checked: ? Every 3-5 years if you are 18-39 years of age. ? Every year if you are 40 years old or older. Diabetes Have regular diabetes screenings. This checks your fasting blood sugar level. Have the screening done:  Once every three years after age 40 if you are at a normal weight and have a low risk for diabetes.  More often and at a younger age if you are overweight or have a high risk for diabetes. What should I know about preventing infection? Hepatitis B If you have a higher risk for hepatitis B, you should be screened for this virus. Talk with your health care provider to find out if you are at risk for hepatitis B infection. Hepatitis C Testing is recommended for:  Everyone born from 1945 through 1965.  Anyone with known risk factors for hepatitis C. Sexually transmitted infections (STIs)  Get screened for STIs, including gonorrhea and chlamydia, if: ? You are sexually active and are younger than 33 years of age. ? You are older than 33 years of age and your health care provider tells you that you are at risk for this type of infection. ? Your sexual activity has changed since you were last screened, and you are at increased risk for chlamydia or gonorrhea. Ask your health care provider if   you are at risk.  Ask your health care provider about whether you are at high risk for HIV. Your health care provider may recommend a prescription medicine to help prevent HIV infection. If you choose to take medicine to prevent HIV, you should first get tested for HIV. You should then be tested every 3 months for as long as you are taking the medicine. Pregnancy  If you are about to stop having your period (premenopausal) and  you may become pregnant, seek counseling before you get pregnant.  Take 400 to 800 micrograms (mcg) of folic acid every day if you become pregnant.  Ask for birth control (contraception) if you want to prevent pregnancy. Osteoporosis and menopause Osteoporosis is a disease in which the bones lose minerals and strength with aging. This can result in bone fractures. If you are 65 years old or older, or if you are at risk for osteoporosis and fractures, ask your health care provider if you should:  Be screened for bone loss.  Take a calcium or vitamin D supplement to lower your risk of fractures.  Be given hormone replacement therapy (HRT) to treat symptoms of menopause. Follow these instructions at home: Lifestyle  Do not use any products that contain nicotine or tobacco, such as cigarettes, e-cigarettes, and chewing tobacco. If you need help quitting, ask your health care provider.  Do not use street drugs.  Do not share needles.  Ask your health care provider for help if you need support or information about quitting drugs. Alcohol use  Do not drink alcohol if: ? Your health care provider tells you not to drink. ? You are pregnant, may be pregnant, or are planning to become pregnant.  If you drink alcohol: ? Limit how much you use to 0-1 drink a day. ? Limit intake if you are breastfeeding.  Be aware of how much alcohol is in your drink. In the [place] one drink equals one 12 oz bottle of beer (355 mL), one 5 oz glass of wine (148 mL), or one 1 oz glass of hard liquor (44 mL). General instructions  Schedule regular health, dental, and eye exams.  Stay current with your vaccines.  Tell your health care provider if: ? You often feel depressed. ? You have ever been abused or do not feel safe at home. Summary  Adopting a healthy lifestyle and getting preventive care are important in promoting health and wellness.  Follow your health care provider's instructions about healthy  diet, exercising, and getting tested or screened for diseases.  Follow your health care provider's instructions on monitoring your cholesterol and blood pressure. This information is not intended to replace advice given to you by your health care provider. Make sure you discuss any questions you have with your health care provider. Document Revised: 08/13/2018 Document Reviewed: 08/13/2018 Elsevier Patient Education  2020 Elsevier Inc.  

## 2020-03-03 NOTE — Progress Notes (Signed)
Rachael [name],   Vitamin D is excellent now. Keep up whatever regimen you are on. I would at least do 1000units a day of D3.  HDL GREAT.  LDL GREAT.  TG just a hair elevated. For now  watch those sugars and carbs and processed foods. You could start a daily fish oil to see if could help TG just a little 2000mg  omega 3.  Kidney, liver, glucose look great.

## 2020-04-07 MED FILL — PHENTERMINE HCL 15 MG CAPS: 15 | 60 days supply | Qty: 60 | Fill #0

## 2020-04-07 MED FILL — TOPIRAMATE 25 MG TAB: 25 | 60 days supply | Qty: 120 | Fill #0

## 2020-07-04 ENCOUNTER — Telehealth (INDEPENDENT_AMBULATORY_CARE_PROVIDER_SITE_OTHER): Payer: No Typology Code available for payment source | Admitting: Physician Assistant

## 2020-07-04 ENCOUNTER — Encounter: Payer: Self-pay | Admitting: Physician Assistant

## 2020-07-04 ENCOUNTER — Other Ambulatory Visit: Payer: Self-pay | Admitting: Physician Assistant

## 2020-07-04 DIAGNOSIS — E663 Overweight: Secondary | ICD-10-CM

## 2020-07-04 MED ORDER — PHENTERMINE HCL 15 MG PO CAPS: 15.0000 mg | ORAL_CAPSULE | ORAL | 0 refills | Status: DC

## 2020-07-04 MED ORDER — TOPIRAMATE 50 MG PO TABS: 50.0000 mg | ORAL_TABLET | Freq: Two times a day (BID) | ORAL | 0 refills | Status: DC

## 2020-07-04 MED FILL — TOPIRAMATE 50 MG TABLET: 50 | 90 days supply | Qty: 180 | Fill #0

## 2020-07-04 MED FILL — PHENTERMINE HCL 15 MG CAPS: 15 | 90 days supply | Qty: 90 | Fill #0

## 2020-07-04 NOTE — Progress Notes (Signed)
..Virtual Visit via Video Note  I connected with [place] on 07/04/20 at  8:30 AM EDT by a video enabled telemedicine application and verified that I am speaking with the correct person using two identifiers.  Location: Patient: work Provider: clinic   I discussed the limitations of evaluation and management by telemedicine and the availability of in person appointments. The patient expressed understanding and agreed to proceed.  History of Present Illness: Patient is a 34 year old female who presents virtually to the clinic to discuss weight.  She is on phentermine and Topamax.  She is tolerating both very well.  She denies any problems sleeping, headaches, anxiety, depression.  She has lost from 1 72-1 69 over the past 3 months.  She does feel like she is reached a plateau.  Her goal is to lose another 10 pounds.  She is trying to exercise a few times a week.  She is mostly just walking.  She continues to make diet changes.  She does feel like this helps her cravings and food choices.  She would like to stay on this medication.     .. Active Ambulatory Problems    Diagnosis Date Noted   Eustachian tube dysfunction 06/30/2014   Family history of breast cancer    Genetic testing 02/22/2015   Family hx of ovarian malignancy 04/03/2016   Overweight (BMI 25.0-29.9) 04/03/2016   Supervision of other normal pregnancy, antepartum 09/18/2017   Skin mole 01/12/2018   Suspicious nevus 01/12/2018   Word finding difficulty 07/10/2019   Medication side effect 07/10/2019   Acute bilateral low back pain with sciatica 11/02/2019   High triglycerides 03/02/2020   Vitamin D insufficiency 03/02/2020   Resolved Ambulatory Problems    Diagnosis Date Noted   Allergy-induced asthma 06/30/2009   Allergic rhinitis 10/11/2011   Normal first pregnancy confirmed, currently in third trimester 06/25/2014   SVD (spontaneous vaginal delivery) 06/26/2014   Pain in left ear 06/30/2014    Pyelectasis of fetus on prenatal ultrasound 12/05/2017   Normal labor 04/26/2018   SVD (spontaneous vaginal delivery) 04/26/2018   Class 1 obesity due to excess calories without serious comorbidity with body mass index (BMI) of 30.0 to 30.9 in adult 10/06/2018   Past Medical History:  Diagnosis Date   Allergy    Asthma    Reviewed med, allergy, problem list.   Observations/Objective: No acute distress Normal mood and appearance.  Normal breathing.   .. Today's Vitals   07/04/20 0813  Weight: 169 lb (76.7 kg)   Body mass index is 27.28 kg/m.    Assessment and Plan: Marland KitchenMarland KitchenGarland was seen today for medication refill.  Diagnoses and all orders for this visit:  Overweight (BMI 25.0-29.9) -     topiramate (TOPAMAX) 50 MG tablet; Take 1 tablet (50 mg total) by mouth 2 (two) times daily. -     phentermine 15 MG capsule; Take 1 capsule (15 mg total) by mouth every morning.   Follow up in 3 months. Goal is to get to BMI of 24. Increased topamax to max dose in qsymia.  Continue phentermine. Pt has no side effects. Discussed exercise and diet changes.    Follow Up Instructions:    I discussed the assessment and treatment plan with the patient. The patient was provided an opportunity to ask questions and all were answered. The patient agreed with the plan and demonstrated an understanding of the instructions.   The patient was advised to call back or seek an in-person evaluation  if the symptoms worsen or if the condition fails to improve as anticipated.   Iran [name], PA-C

## 2020-08-18 ENCOUNTER — Encounter: Payer: Self-pay | Admitting: Physician Assistant

## 2020-08-19 MED ORDER — VALACYCLOVIR HCL 1 G PO TABS: 2000.0000 mg | ORAL_TABLET | Freq: Two times a day (BID) | ORAL | 0 refills | Status: DC

## 2020-09-14 ENCOUNTER — Telehealth: Payer: Self-pay

## 2020-09-14 DIAGNOSIS — Z789 Other specified health status: Secondary | ICD-10-CM

## 2020-09-14 MED ORDER — ETONOGESTREL-ETHINYL ESTRADIOL 0.12-0.015 MG/24HR VA RING: VAGINAL_RING | VAGINAL | 1 refills | Status: DC

## 2020-09-14 NOTE — Telephone Encounter (Signed)
Pt requesting refill of Nuva ring. Refill sent. Pt is aware she needs annual at the end of March.

## 2020-10-06 ENCOUNTER — Encounter: Payer: Self-pay | Admitting: Physician Assistant

## 2020-10-11 ENCOUNTER — Telehealth (INDEPENDENT_AMBULATORY_CARE_PROVIDER_SITE_OTHER): Payer: BC Managed Care – PPO | Admitting: Physician Assistant

## 2020-10-11 ENCOUNTER — Encounter: Payer: Self-pay | Admitting: Physician Assistant

## 2020-10-11 VITALS — BP 128/78 | HR 76 | Ht 66.0 in | Wt 168.0 lb

## 2020-10-11 DIAGNOSIS — E663 Overweight: Secondary | ICD-10-CM

## 2020-10-11 DIAGNOSIS — M542 Cervicalgia: Secondary | ICD-10-CM | POA: Insufficient documentation

## 2020-10-11 DIAGNOSIS — R519 Headache, unspecified: Secondary | ICD-10-CM | POA: Diagnosis not present

## 2020-10-11 MED ORDER — PHENTERMINE HCL 15 MG PO CAPS: 15.0000 mg | ORAL_CAPSULE | ORAL | 0 refills | Status: DC

## 2020-10-11 MED ORDER — TOPIRAMATE 50 MG PO TABS: 50.0000 mg | ORAL_TABLET | Freq: Two times a day (BID) | ORAL | 0 refills | Status: DC

## 2020-10-11 NOTE — Progress Notes (Signed)
..Virtual Visit via Telephone Note  I connected with [place] on 10/11/20 at  9:30 AM EST by telephone and verified that I am speaking with the correct person using two identifiers.  Location: Patient: work Provider: clinic  .Marland KitchenParticipating in visit:  Patient: Rachael [name] Provider: Iran [name] PA-C   I discussed the limitations, risks, security and privacy concerns of performing an evaluation and management service by telephone and the availability of in person appointments. I also discussed with the patient that there may be a patient responsible charge related to this service. The patient expressed understanding and agreed to proceed.   History of Present Illness: Pt is a 35 yo female who presents to the clinic for weight management.   She is on topamax/phentermine combination. She denies any side effects. No problems or concerns. She can tell the days she does not take it. She is maintaining for the most part but also lost a few pounds as well. She is actively walking and making better food choices.   She does report more neck pain and tightness that seem to be causing a headache.  Pain is at the base of her neck. No injury. She has been more stressed recently. No nausea, photo or phono sensitivity.   .. Active Ambulatory Problems    Diagnosis Date Noted  . Eustachian tube dysfunction 06/30/2014  . Family history of breast cancer   . Genetic testing 02/22/2015  . Family hx of ovarian malignancy 04/03/2016  . Overweight (BMI 25.0-29.9) 04/03/2016  . Supervision of other normal pregnancy, antepartum 09/18/2017  . Skin mole 01/12/2018  . Suspicious nevus 01/12/2018  . Word finding difficulty 07/10/2019  . Medication side effect 07/10/2019  . Acute bilateral low back pain with sciatica 11/02/2019  . High triglycerides 03/02/2020  . Vitamin D insufficiency 03/02/2020  . Neck pain 10/11/2020  . Frequent headaches 10/11/2020   Resolved Ambulatory Problems    Diagnosis Date  Noted  . Allergy-induced asthma 06/30/2009  . Allergic rhinitis 10/11/2011  . Normal first pregnancy confirmed, currently in third trimester 06/25/2014  . SVD (spontaneous vaginal delivery) 06/26/2014  . Pain in left ear 06/30/2014  . Pyelectasis of fetus on prenatal ultrasound 12/05/2017  . Normal labor 04/26/2018  . SVD (spontaneous vaginal delivery) 04/26/2018  . Class 1 obesity due to excess calories without serious comorbidity with body mass index (BMI) of 30.0 to 30.9 in adult 10/06/2018   Past Medical History:  Diagnosis Date  . Allergy   . Asthma    Reviewed med, allergies, problem list.     Observations/Objective: No acute distress Normal mood  .[name] Today's Vitals   10/11/20 0922  BP: 128/78  Pulse: 76  Weight: 168 lb (76.2 kg)  Height: 5\' 6"  (1.676 m)   Body mass index is 27.12 kg/m.    Assessment and Plan: Marland KitchenMarland KitchenDiagnoses and all orders for this visit:  Overweight (BMI 25.0-29.9) -     topiramate (TOPAMAX) 50 MG tablet; Take 1 tablet (50 mg total) by mouth 2 (two) times daily. -     phentermine 15 MG capsule; Take 1 capsule (15 mg total) by mouth every morning.  Neck pain  Frequent headaches   HA sound muscular and due to strain and stress. Discussed massage therapy, icy hot patches, good supportive pillow. NSAIDs as needed.   Refilled topamax and phentermine or weight. Stable weight. Keep making good choices. BMI 27 doing well.   Follow up in 6 months.    Follow Up Instructions:  I discussed the assessment and treatment plan with the patient. The patient was provided an opportunity to ask questions and all were answered. The patient agreed with the plan and demonstrated an understanding of the instructions.   The patient was advised to call back or seek an in-person evaluation if the symptoms worsen or if the condition fails to improve as anticipated.  I provided 10 minutes of non-face-to-face time during this encounter.   Iran [name],  PA-C

## 2020-10-21 ENCOUNTER — Encounter: Payer: Self-pay | Admitting: Family Medicine

## 2020-10-21 ENCOUNTER — Telehealth (INDEPENDENT_AMBULATORY_CARE_PROVIDER_SITE_OTHER): Payer: BC Managed Care – PPO | Admitting: Family Medicine

## 2020-10-21 VITALS — Temp 99.0°F

## 2020-10-21 DIAGNOSIS — H9203 Otalgia, bilateral: Secondary | ICD-10-CM | POA: Diagnosis not present

## 2020-10-21 DIAGNOSIS — J0100 Acute maxillary sinusitis, unspecified: Secondary | ICD-10-CM

## 2020-10-21 DIAGNOSIS — J020 Streptococcal pharyngitis: Secondary | ICD-10-CM

## 2020-10-21 MED ORDER — AMOXICILLIN-POT CLAVULANATE 875-125 MG PO TABS: 1.0000 | ORAL_TABLET | Freq: Two times a day (BID) | ORAL | 0 refills | Status: AC

## 2020-10-21 NOTE — Progress Notes (Addendum)
Virtual Video Visit via MyChart Note  I connected with  [place] on 10/21/20 at  2:00 PM EST by the video enabled telemedicine application for , MyChart, and verified that I am speaking with the correct person using two identifiers.   I introduced myself as a Designer, jewellery with the practice. We discussed the limitations of evaluation and management by telemedicine and the availability of in person appointments. The patient expressed understanding and agreed to proceed.  Participating parties in this visit include: The patient and the nurse practitioner listed.   The patient is: At home I am: In the office - [facility]  Subjective:    CC:  Chief Complaint  Patient presents with  . URI    HPI: Rachael [name] is a 35 y.o. year old female presenting today via [place] today for sore throat and ear pain.  About 5 days ago, patient developed some sinus congestion that has now progressed to 8/10 ear pain, mostly in left ear, 8/10 sore throat and some maxillary sinus pressure. She reports general malaise, fatigue and temperature of 100 F today. She has been trying Tylenol Cold and Sinus and her regular zyrtec with minimal relief. She reports rare coughing, denies shortness of breath and chest pain. States both ears feel full with pressure, but the left has throbbing 8/10 pain. Also states her throat appears red with swollen tonsils and post nasal drainage. She palpated her anterior cervical, submandibular, and tonsillar lymph nodes stating they were swollen bilaterally and tender to touch. States this is how she feels when she gets Strep throat.  Not yet tested for COVID    Past medical history, Surgical history, Family history not pertinant except as noted below, Social history, Allergies, and medications have been entered into the medical record, reviewed, and corrections made.   Review of Systems:  All review of systems negative except what is listed in the HPI   Objective:     General:  Speaking clearly in complete sentences. Absent shortness of breath noted.   Alert and oriented x3.   Normal judgment.  Absent acute distress. Does appear fatigued. Hoarse voice.   Impression and Recommendations:    1. Strep pharyngitis - amoxicillin-clavulanate (AUGMENTIN) 875-125 MG tablet; Take 1 tablet by mouth 2 (two) times daily for 10 days.  Dispense: 20 tablet; Refill: 0 2. Otalgia of both ears - amoxicillin-clavulanate (AUGMENTIN) 875-125 MG tablet; Take 1 tablet by mouth 2 (two) times daily for 10 days.  Dispense: 20 tablet; Refill: 0  Patient with symptoms concerning for sinusitis, strep throat, and ear infection. Due to her presentation and significantly worsening for the past few days we will treat empirically with Augmentin. Patient encouraged to still take a COVID test and follow appropriate quarantine recommendations. Educated that if this is COVID and not a bacterial infection, then the antibiotic will not be helpful. Continue with OTC management for supportive therapy and let us know if not improving or worsening in the next few days.    Follow-up if symptoms worsen or fail to improve.    I discussed the assessment and treatment plan with the patient. The patient was provided an opportunity to ask questions and all were answered. The patient agreed with the plan and demonstrated an understanding of the instructions.   The patient was advised to call back or seek an in-person evaluation if the symptoms worsen or if the condition fails to improve as anticipated.  I provided 20 minutes of non-face-to-face interaction with this  [place] visit including intake, same-day documentation, and chart review.   Terrilyn Saver, NP

## 2020-10-21 NOTE — Patient Instructions (Signed)
It sounds like you may be getting an ear infection and strep. Since we couldn't test you today, I sent in an antibiotic that will help with both of these. However, please take a COVID test and follow quarantine recommendations. If this is not a bacterial infection, then the antibiotics will not be helpful. Continue over the counter supportive therapy including the list below. I hope you feel better soon!  Over the counter medications that may be helpful for symptoms:    . Guaifenesin 1200 mg extended release tabs twice daily, with plenty of water o For cough and congestion o Brand name: Mucinex   . Pseudoephedrine 30 mg, one or two tabs every 4 to 6 hours o For sinus congestion o Brand name: Sudafed o You must get this from the pharmacy counter.  . Oxymetazoline nasal spray each morning, one spray in each nostril, for NO MORE THAN 3 days  o For nasal and sinus congestion o Brand name: Afrin . Saline nasal spray or Saline Nasal Irrigation 3-5 times a day o For nasal and sinus congestion o Brand names: [place] or AYR . Fluticasone nasal spray, one spray in each nostril, each morning (after oxymetazoline and saline, if used) o For nasal and sinus congestion o Brand name: Flonase . Warm salt water gargles  o For sore throat o Every few hours as needed . Alternate ibuprofen 400-600 mg and acetaminophen 1000 mg every 4-6 hours o For fever, body aches, headache o Brand names: Motrin or Advil and Tylenol . Dextromethorphan 12-hour cough version 30 mg every 12 hours  o For cough o Brand name: Delsym Stop all other cold medications for now (Nyquil, Dayquil, Tylenol Cold, Theraflu, etc) and other non-prescription cough/cold preparations. Many of these have the same ingredients listed above and could cause an overdose of medication.   Herbal treatments that have been shown to be helpful in some patients include: Vitamin C 1000mg  per day Vitamin D 4000iU per day Zinc 100mg  per day Quercetin  25-500mg  twice a day Melatonin 5-10mg  at bedtime  General Instructions . Allow your body to rest . Drink PLENTY of fluids . Isolate yourself from everyone, even family, until test results have returned  If your COVID-19 test is positive . Then you ARE INFECTED and you can pass the virus to others . You must quarantine from others for a minimum of  o 10 days since symptoms started AND o You are fever free for 24 hours WITHOUT any medication to reduce fever AND o Your symptoms are improving . Do not go to the store or other public areas . Do not go around household members who are not known to be infected with COVID-19 . If you MUST leave your area of quarantine (example: go to a bathroom you share with others in your home), you must o Wear a mask o Wash your hands thoroughly o Wipe down any surfaces you touch . Do not share food, drinks, towels, or other items with other persons . Dispose of your own tissues, food containers, etc  Once you have recovered, please continue good preventive care measures, including:  . wearing a mask when in public . wash your hands frequently . avoid touching your face/nose/eyes . cover coughs/sneezes with the inside of your elbow . stay out of crowds . keep a 6 foot distance from others  If you develop severe shortness of breath, uncontrolled fevers, coughing up blood, confusion, chest pain, or signs of dehydration (such as significantly decreased urine  amounts or dizziness with standing) please go to the ER.

## 2020-10-21 NOTE — Progress Notes (Signed)
Pt stated her symptoms started last week soar throat, loss of voice, ears hurt and stuffy nose.  OTC sudafed

## 2020-12-01 ENCOUNTER — Encounter: Payer: Self-pay | Admitting: Obstetrics and Gynecology

## 2020-12-01 ENCOUNTER — Ambulatory Visit (INDEPENDENT_AMBULATORY_CARE_PROVIDER_SITE_OTHER): Payer: BC Managed Care – PPO | Admitting: Obstetrics and Gynecology

## 2020-12-01 ENCOUNTER — Other Ambulatory Visit (HOSPITAL_COMMUNITY)
Admission: RE | Admit: 2020-12-01 | Discharge: 2020-12-01 | Disposition: A | Discharge status: Home / Self Care | Payer: BC Managed Care – PPO | Source: Ambulatory Visit | Attending: Obstetrics and Gynecology | Admitting: Obstetrics and Gynecology

## 2020-12-01 ENCOUNTER — Other Ambulatory Visit: Payer: Self-pay

## 2020-12-01 VITALS — BP 128/82 | HR 101 | Ht 65.0 in | Wt 172.0 lb

## 2020-12-01 DIAGNOSIS — Z01419 Encounter for gynecological examination (general) (routine) without abnormal findings: Secondary | ICD-10-CM

## 2020-12-01 DIAGNOSIS — N939 Abnormal uterine and vaginal bleeding, unspecified: Secondary | ICD-10-CM

## 2020-12-01 DIAGNOSIS — Z3009 Encounter for other general counseling and advice on contraception: Secondary | ICD-10-CM

## 2020-12-01 DIAGNOSIS — Z113 Encounter for screening for infections with a predominantly sexual mode of transmission: Secondary | ICD-10-CM

## 2020-12-01 DIAGNOSIS — Z30430 Encounter for insertion of intrauterine contraceptive device: Secondary | ICD-10-CM | POA: Diagnosis not present

## 2020-12-01 DIAGNOSIS — Z124 Encounter for screening for malignant neoplasm of cervix: Secondary | ICD-10-CM | POA: Diagnosis not present

## 2020-12-01 LAB — POCT URINE PREGNANCY: Preg Test, Ur: NEGATIVE

## 2020-12-01 MED ORDER — LEVONORGESTREL 20 MCG/24HR IU IUD: INTRAUTERINE_SYSTEM | Freq: Once | INTRAUTERINE | Status: AC

## 2020-12-01 NOTE — Progress Notes (Signed)
GYNECOLOGY ANNUAL PREVENTATIVE CARE ENCOUNTER NOTE  Subjective:   Rachael [name] is a 35 y.o. [id number] female here for a annual gynecologic exam. Current complaints: abnormal uterine bleeding. Has long history of bleeding, heavy bleeding with bad cramps. Has tried OCPs with no improvement, on nuvaring but it is a hassle and she is looking for something that works better. Tried to have an ablation several years ago but failed due to small size of uterus. Interested in IUD.    Denies abnormal vaginal bleeding, discharge, pelvic pain, problems with intercourse or other gynecologic concerns. Declines STI screen.   Gynecologic History Patient's last menstrual period was 10/30/2020. Contraception: tubal ligation Last Pap: 05/2017. Results: normal Last mammogram: n/a  Obstetric History OB History  Gravida Para Term Preterm AB Living  2 2 2     2   SAB IAB Ectopic Multiple Live Births        0 2    # Outcome Date GA Lbr Len/2nd Weight Sex Delivery Anes PTL Lv  2 Term 04/26/18 [redacted]w[redacted]d 08:40 / 01:37 7 lb 9.5 oz (3.445 kg) M Vag-Spont EPI  LIV  1 Term 06/26/14 [redacted]w[redacted]d 12:20 / 03:56 8 lb 2 oz (3.685 kg) M Vag-Spont EPI  LIV    Past Medical History:  Diagnosis Date  . Allergy   . Asthma   . Family history of breast cancer   . SVD (spontaneous vaginal delivery) 06/26/2014    Past Surgical History:  Procedure Laterality Date  . DILATION AND CURETTAGE OF UTERUS N/A 05/20/2019   Procedure: DILATATION AND CURETTAGE;  Surgeon: Emily Filbert, MD;  Location: [facility];  Service: Gynecology;  Laterality: N/A;  . ENDOMETRIAL ABLATION N/A 05/20/2019   Procedure: ATTEMPTED MINERVA  ABLATION;  Surgeon: Emily Filbert, MD;  Location: [facility];  Service: Gynecology;  Laterality: N/A;  minerva rep will be here confirmed on 05/15/19 CS  . LAPAROSCOPIC BILATERAL SALPINGECTOMY N/A 05/20/2019   Procedure: LAPAROSCOPIC BILATERAL SALPINGECTOMY;  Surgeon: Emily Filbert, MD;  Location:  [facility];  Service: Gynecology;  Laterality: N/A;  . WISDOM TOOTH EXTRACTION      Current Outpatient Medications on File Prior to Visit  Medication Sig Dispense Refill  . cetirizine (ZYRTEC) 10 MG tablet Take 10 mg by mouth daily.    . phentermine 15 MG capsule Take 1 capsule (15 mg total) by mouth every morning. 90 capsule 0  . topiramate (TOPAMAX) 50 MG tablet Take 1 tablet (50 mg total) by mouth 2 (two) times daily. 180 tablet 0  . valACYclovir (VALTREX) 1000 MG tablet Take 2 tablets (2,000 mg total) by mouth 2 (two) times daily. For 1 day for outbreak (Patient not taking: Reported on 10/21/2020) 10 tablet 0   No current facility-administered medications on file prior to visit.    Not on File  Social History   Socioeconomic History  . Marital status: Married    Spouse name: Ysidro Evert  . Number of children: 0  . Years of education: Not on file  . Highest education level: Not on file  Occupational History  . Occupation: [employer]    Employer: [employer]  Tobacco Use  . Smoking status: Never Smoker  . Smokeless tobacco: Never Used  Vaping Use  . Vaping Use: Former  Substance and Sexual Activity  . Alcohol use: Yes    Comment: social use  . Drug use: No  . Sexual activity: Yes    Birth control/protection: Pill  Other Topics Concern  . Not on file  Social History Narrative   Lives with husband   1 dog   Expecting first child   Works at [employer] ([place])   Working on Museum/gallery exhibitions officer at [employer] Strain: Not on [employer] Insecurity: Not on file  Transportation Needs: Not on file  Physical Activity: Not on file  Stress: Not on file  Social Connections: Not on file  Intimate Partner Violence: Not on file    Family History  Problem Relation Age of Onset  . Breast cancer Maternal Grandmother        Dx 10s; deceased 43s  . Diabetes Maternal Grandmother   .  Cancer Maternal Grandmother   . Breast cancer Paternal Grandmother        Dx 69s; deceased 80s  . Diabetes Paternal Grandmother   . Cancer Paternal Grandmother   . Ovarian cancer Mother   . Diabetes Other        both parents  . Diabetes Maternal Grandfather   . Breast cancer Other        pat grandmother's mother  . Diabetes Paternal Grandfather     The following portions of the patient's history were reviewed and updated as appropriate: allergies, current medications, past family history, past medical history, past social history, past surgical history and problem list.  Review of Systems Pertinent items are noted in HPI.   Objective:  BP 128/82   Pulse (!) 101   Ht 5\' 5"  (1.651 m)   Wt 172 lb (78 kg)   LMP 10/30/2020   BMI 28.62 kg/m  CONSTITUTIONAL: Well-developed, well-nourished female in no acute distress.  HENT:  Normocephalic, atraumatic, External right and left ear normal. Oropharynx is clear and moist EYES: Conjunctivae and EOM are normal. Pupils are equal, round, and reactive to light. No scleral icterus.  NECK: Normal range of motion, supple, no masses.  Normal thyroid.  SKIN: Skin is warm and dry. No rash noted. Not diaphoretic. No erythema. No pallor. NEUROLOGIC: Alert and oriented to person, place, and time. Normal reflexes, muscle tone coordination. No cranial nerve deficit noted. PSYCHIATRIC: Normal mood and affect. Normal behavior. Normal judgment and thought content. CARDIOVASCULAR: Normal heart rate noted RESPIRATORY: Effort normal, no problems with respiration noted. BREASTS: Symmetric in size. No masses, skin changes, nipple drainage, or lymphadenopathy. ABDOMEN: Soft, no distention noted.  No tenderness, rebound or guarding.  PELVIC: Normal appearing external genitalia; normal appearing vaginal mucosa and cervix.  No abnormal discharge noted.  Pap smear obtained. Pelvic cultures obtained. IUD placed, see note Normal uterine size, no other palpable masses, no  uterine or adnexal tenderness. MUSCULOSKELETAL: Normal range of motion. No tenderness.  No cyanosis, clubbing, or edema.  2+ distal pulses.  Exam done with chaperone present.  Assessment and Plan:   1. Encounter for counseling regarding contraception Patient interested in IUD, counseled regarding risks/benefits, she is agreeable - see note for placement   2. Routine screening for STI (sexually transmitted infection) Pt has no exposures/concerns but recommended Gc/CT with insertion of IUD, she is agreeable   3. Well woman exam Healthy female exam  4. Cervical cancer screening Pap today  5. Abnormal uterine bleeding (AUB) Has tried multiple methods but not IUD before - agreeable to try IUD  6. Encounter for insertion of mirena IUD   Will follow up results of pap smear/STI screen and manage accordingly. Encouraged improvement in diet and exercise.  Accepts STI screen. COVID vaccine declines Mammogram n/a Referral for colonoscopy n/a  Routine preventative health maintenance measures emphasized. Please refer to After Visit Summary for other counseling recommendations.    Feliz Beam, MD, [place] for [facility])

## 2020-12-01 NOTE — Progress Notes (Signed)
    IUD INSERTION PROCEDURE NOTE  Rachael [name] is a 35 y.o. [id number] here for Mirena insertion. No GYN concerns.   She was counseled regarding the risks/benefits of IUD including insertion risk of infection, hemorrhage, damage to surrounding tissue and organs, uterine perforation. She was counseled regarding risks of IUD including implantation into uterine wall, malpositioning, misplacement out of the uterus, migration outside of uterus, possible need for hysteroscopic or laparoscopic removal, ovarian cysts, expulsion. She was advised that risk of pregnancy is low with negative UPT but is not zero and IUD insertion may cause miscarriage. Reviewed that she is also at slightly higher risk for ectopic pregnancy and she should take a pregnancy test if she believes she may be pregnant. She was advised to use backup method of protection for one week. She verbalized understanding of all of the above and consent signed.   Last intercourse was protected Last pap smear was on today UPT today: negative  IUD Insertion  Patient identified and an adequate time out was performed. Speculum placed in the vagina. The cervix was cleaned with Betadine x 2 and grasped anteriorly with a single tooth tenaculum.  A uterine sound was used to sound the uterus to 7 cm;  the IUD was then placed per manufacturer's recommendations. Strings trimmed to 3 cm. Tenaculum was removed, good hemostasis noted with pressure. Patient tolerated procedure well.   Patient was given post-procedure instructions.  She was reminded to have backup contraception for one week during this transition period between IUDs.  Patient was also asked to check IUD strings periodically and follow up in 4 weeks for IUD check.  Mirena IUD Exp: 05/2022 Lot: [id number]  K. Arvilla Meres, MD, [place] for [facility])

## 2020-12-01 NOTE — Progress Notes (Signed)
Pt interested in IUD Last pap- 05/22/17- negative

## 2020-12-02 DIAGNOSIS — R87611 Atypical squamous cells cannot exclude high grade squamous intraepithelial lesion on cytologic smear of cervix (ASC-H): Secondary | ICD-10-CM | POA: Diagnosis not present

## 2020-12-06 LAB — CYTOLOGY - PAP
Chlamydia: NEGATIVE
Comment: NEGATIVE
Comment: NEGATIVE
Comment: NORMAL
Diagnosis: HIGH — AB
High risk HPV: NEGATIVE
Neisseria Gonorrhea: NEGATIVE

## 2020-12-10 IMAGING — US TRANSVAGINAL ULTRASOUND OF PELVIS
1 series · 14 of 25 positions shown · non-contrast
Comparison: None.

CLINICAL DATA: Dysfunctional uterine bleeding.

EXAM:
ULTRASOUND PELVIS TRANSVAGINAL
TECHNIQUE: Transvaginal ultrasound examination of the pelvis was performed
including evaluation of the uterus, ovaries, adnexal regions, and
pelvic cul-de-sac.

[Series 1: transvaginal ultrasound of pelvis · 0.08mm/px · 14 of 53 slices shown]
[im 1/53]
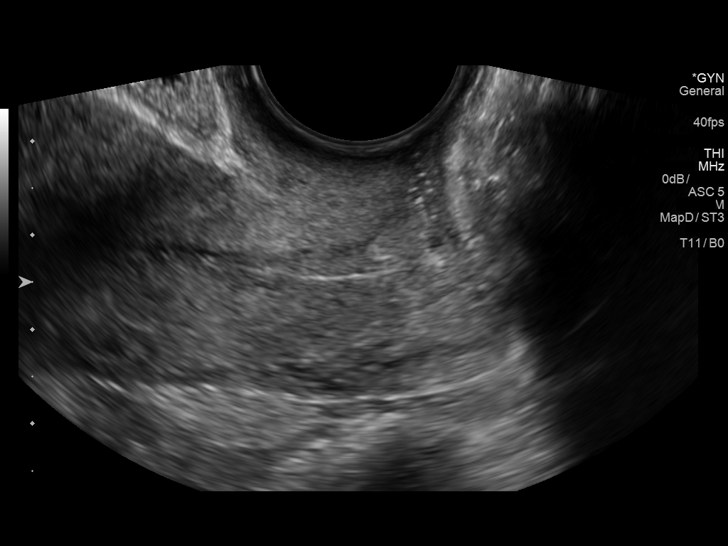
[im 5/53]
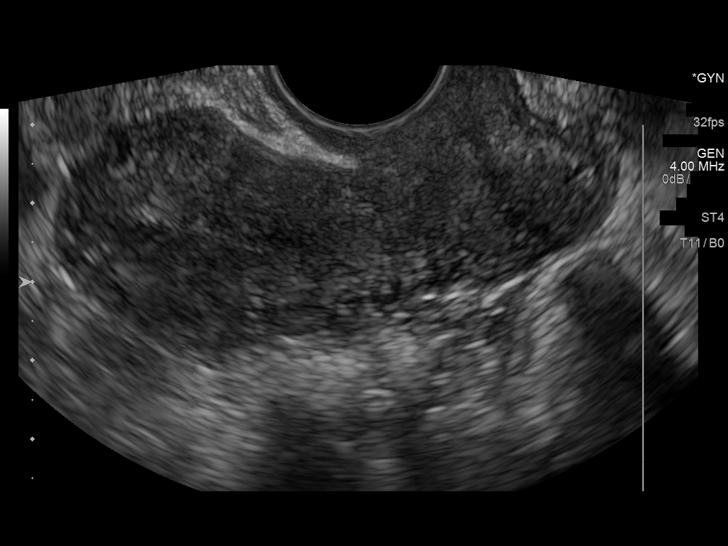
[im 9/53]
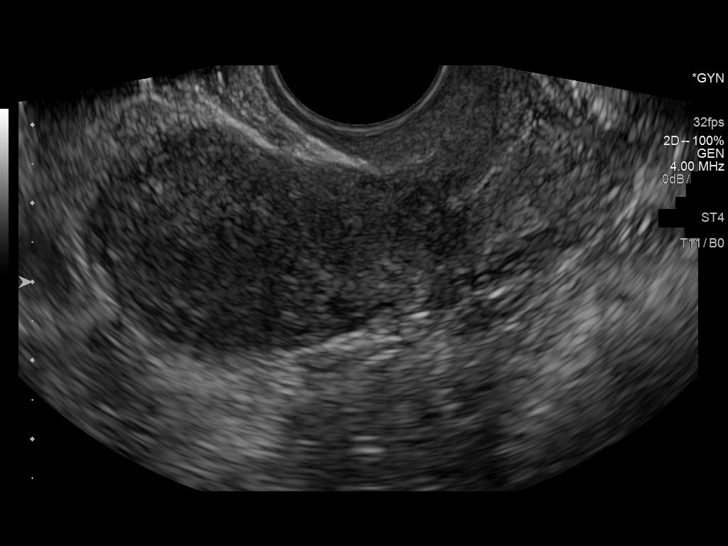
[im 14/53]
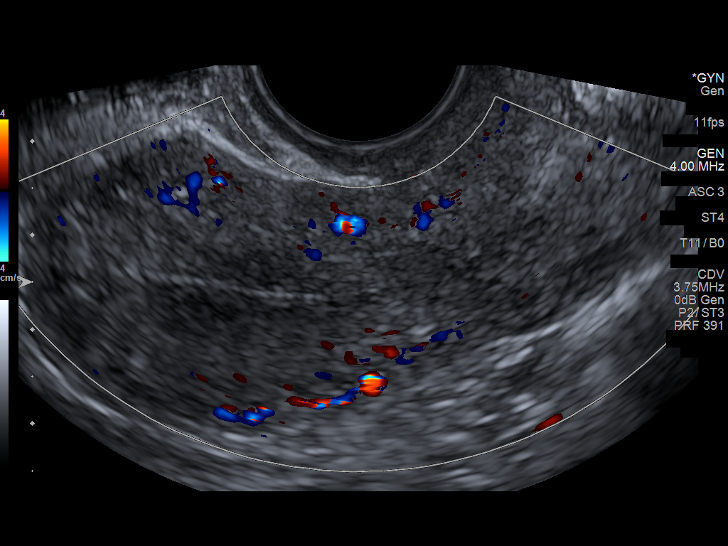
[im 18/53]
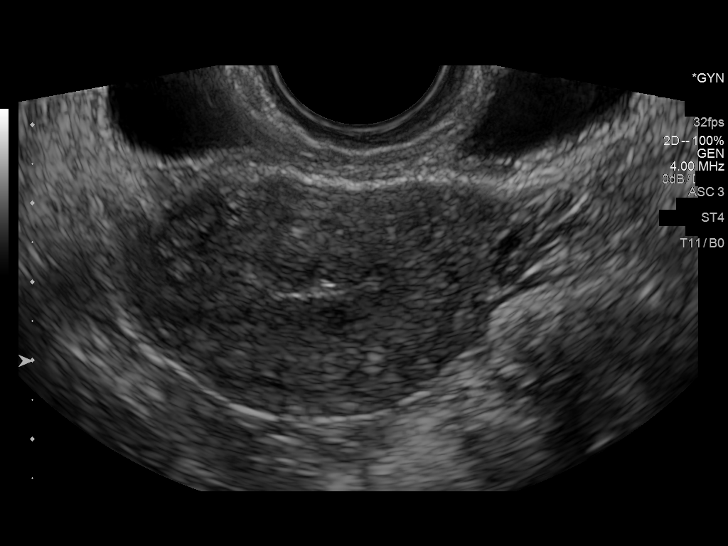
[im 20/53]
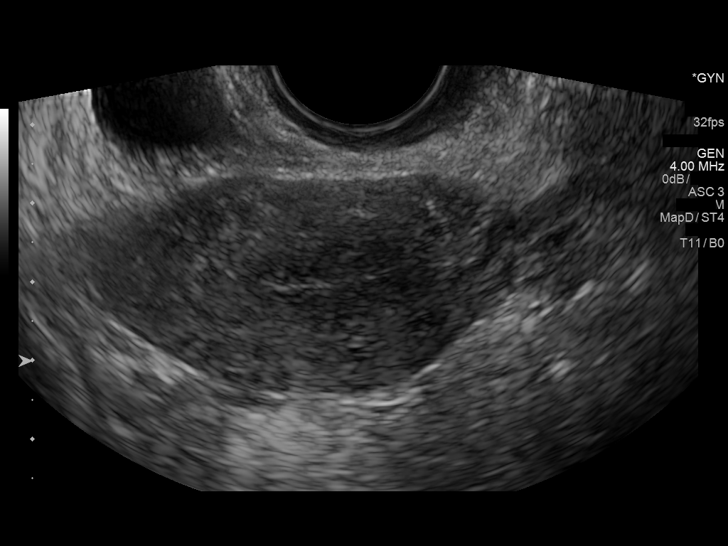
[im 24/53]
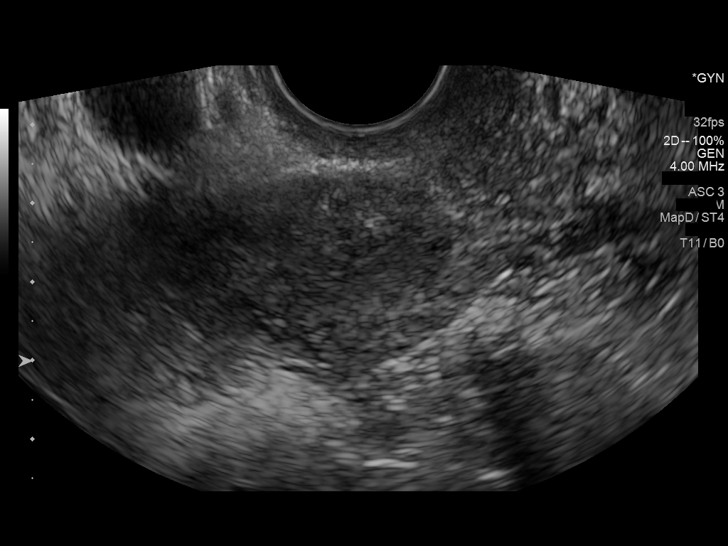
[im 29/53]
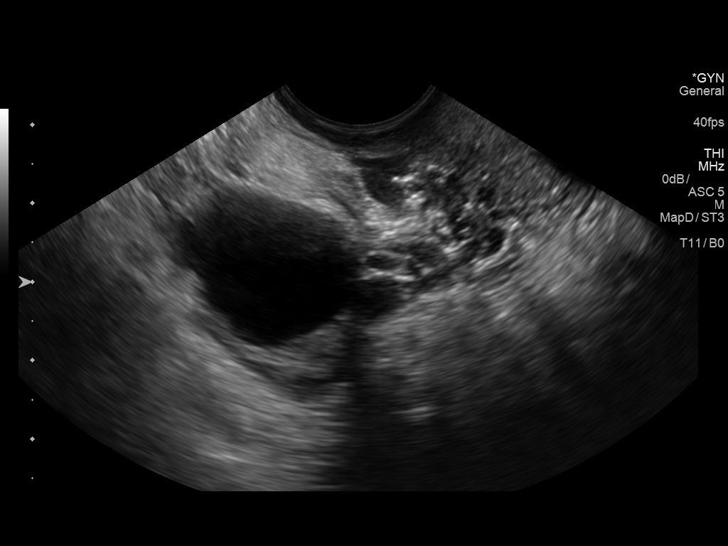
[im 33/53]
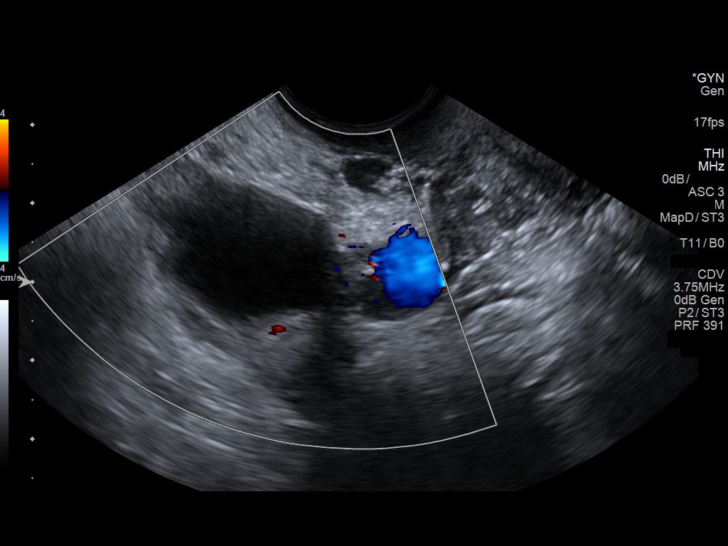
[im 35/53]
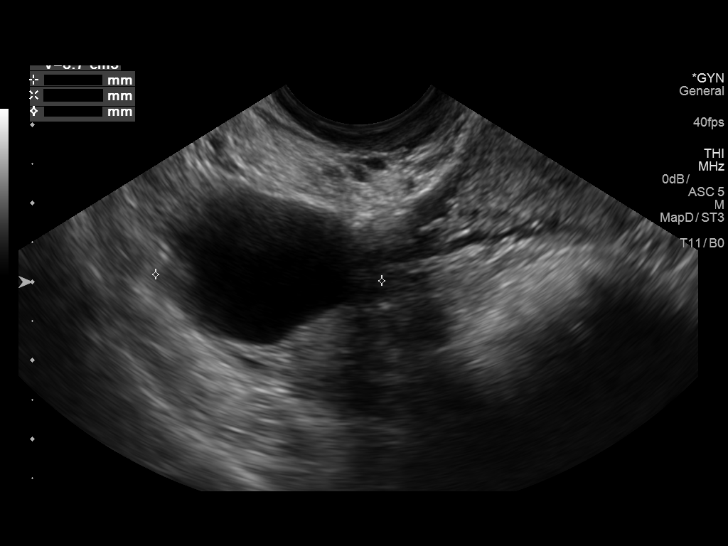
[im 40/53]
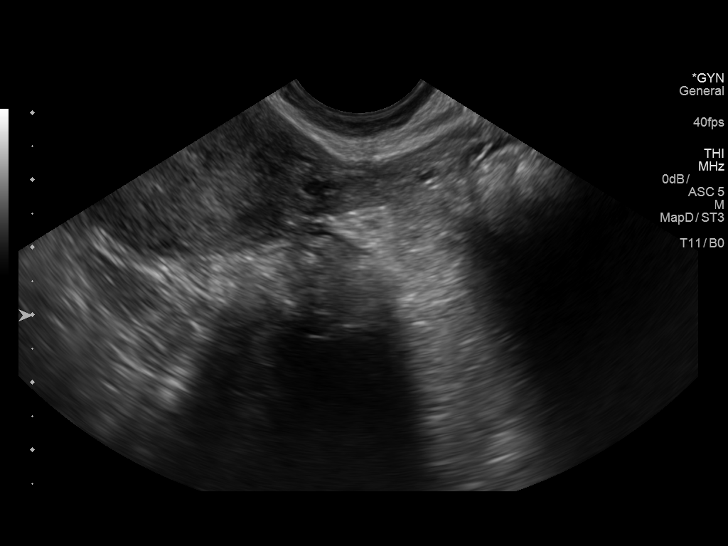
[im 44/53]
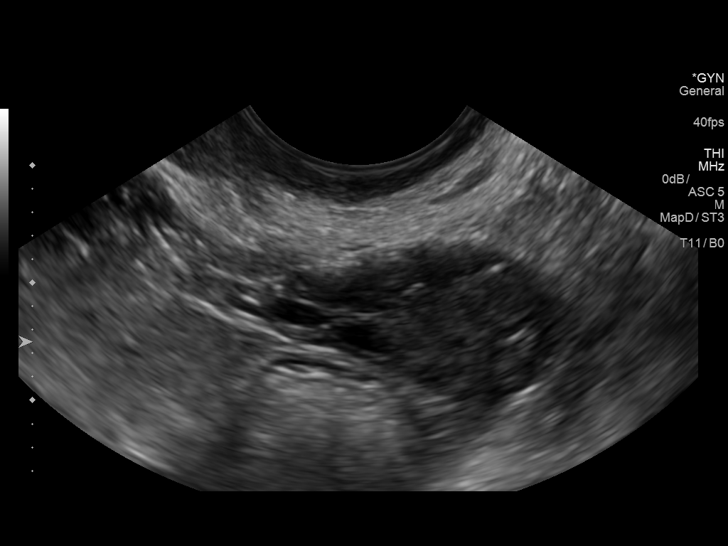
[im 48/53]
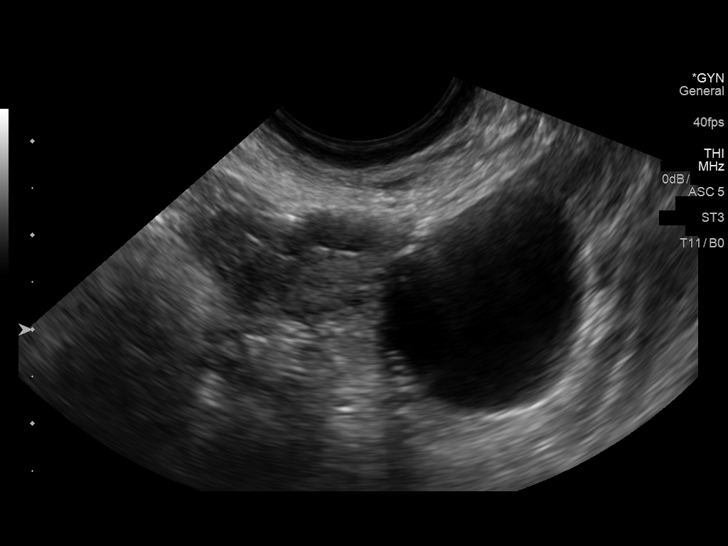
[im 53/53]
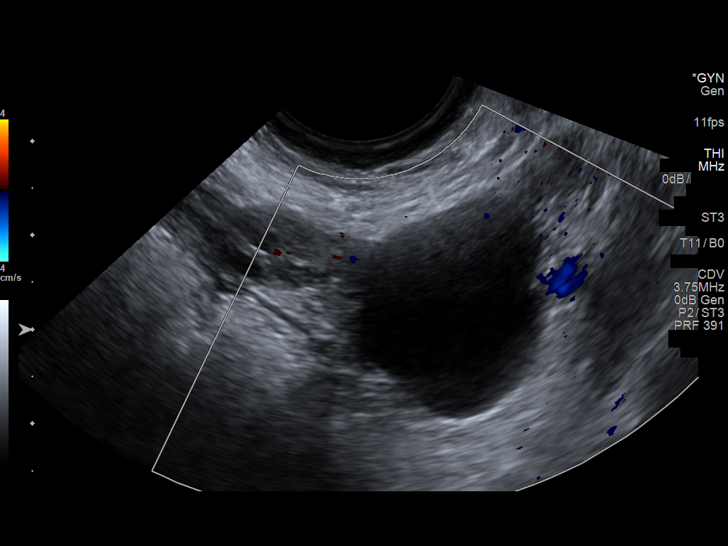

[14 of 25 positions shown; findings below may reference images not displayed]

FINDINGS: Uterus

Measurements: 7.1 x 4.6 x 3.3 cm = volume: 55 mL. No fibroids or
other mass visualized.

Endometrium

Thickness: 1 mm which is within normal limits. No focal abnormality
visualized.

Right ovary

Measurements: 2.8 x 2.9 x 2.1 cm = volume: 9 mL. 2.3 cm follicular
cyst is noted.

Left ovary

Measurements: 2.4 x 2.1 x 1.4 cm = volume: 4 mL. 2.2 cm follicular
cyst is noted.

Other findings:  No abnormal free fluid.
IMPRESSION: No significant abnormality is noted. If bleeding remains
unresponsive to hormonal or medical therapy, sonohysterogram should
be considered for focal lesion work-up. (Ref: Radiological
Reasoning: Algorithmic Workup of Abnormal Vaginal Bleeding with
Endovaginal Sonography and Sonohysterography. [id number]; [id number])

## 2020-12-29 ENCOUNTER — Encounter: Payer: Self-pay | Admitting: Obstetrics & Gynecology

## 2020-12-29 ENCOUNTER — Ambulatory Visit (INDEPENDENT_AMBULATORY_CARE_PROVIDER_SITE_OTHER): Payer: BC Managed Care – PPO | Admitting: Obstetrics & Gynecology

## 2020-12-29 ENCOUNTER — Other Ambulatory Visit: Payer: Self-pay

## 2020-12-29 ENCOUNTER — Other Ambulatory Visit (HOSPITAL_COMMUNITY)
Admission: RE | Admit: 2020-12-29 | Discharge: 2020-12-29 | Disposition: A | Discharge status: Home / Self Care | Payer: BC Managed Care – PPO | Source: Ambulatory Visit | Attending: Obstetrics & Gynecology | Admitting: Obstetrics & Gynecology

## 2020-12-29 VITALS — BP 119/82 | HR 99 | Resp 16 | Ht 66.0 in | Wt 173.0 lb

## 2020-12-29 DIAGNOSIS — R87611 Atypical squamous cells cannot exclude high grade squamous intraepithelial lesion on cytologic smear of cervix (ASC-H): Secondary | ICD-10-CM | POA: Diagnosis not present

## 2020-12-29 DIAGNOSIS — N870 Mild cervical dysplasia: Secondary | ICD-10-CM | POA: Diagnosis not present

## 2020-12-29 DIAGNOSIS — Z30431 Encounter for routine checking of intrauterine contraceptive device: Secondary | ICD-10-CM | POA: Diagnosis not present

## 2020-12-29 LAB — POCT URINE PREGNANCY: Preg Test, Ur: NEGATIVE

## 2020-12-29 NOTE — Progress Notes (Signed)
    GYNECOLOGY OFFICE COLPOSCOPY PROCEDURE NOTE AND IUD CHECK UP  [name] here for colposcopy for ASC cannot exclude high grade lesion [facility]) with negative HPV on pap smear on 12/01/2020. Discussed role for HPV in cervical dysplasia, need for surveillance for abnormal cytology results.  Patient also had Mirena IUD placed  On 12/01/2020, reports periodic spotting, no other concerns.  Patient gave informed written consent, time out was performed.  Placed in lithotomy position. Vaginal speculum placed.  Cervix viewed. Mirena strings visualized about 3 cm in length.  Cervix was then viewed again with colposcope after application of acetic acid.   Colposcopy adequate? Yes Acetowhite lesion(s) noted at 12 and 5 o'clock; corresponding biopsies obtained.  ECC specimen obtained.  All specimens were labeled and sent to pathology. There was significant bleeding noted from biopsy sites, used a lot of fox swabs and applied pressure with fox swabs and Monsel's solution.    Patient was given post procedure instructions.  Will follow up pathology and manage accordingly; patient will be contacted with results and recommendations. No issues with IUD, this can be in place for up to seven years.  Routine preventative health maintenance measures emphasized.    Verita Schneiders, MD, [place] for [facility], [place]

## 2020-12-29 NOTE — Patient Instructions (Signed)
COLPOSCOPY POST-PROCEDURE INSTRUCTIONS  1. You may take Ibuprofen, Aleve or Tylenol for cramping if needed.  2. If Monsel's solution was used, you will have a black discharge.  3. Light bleeding is normal.  If bleeding is heavier than your period, please call.  4. Put nothing in your vagina until the bleeding or discharge stops (usually 2 or 3 days).  5. We will call you within one week with biopsy results or discuss the results at your follow-up appointment if needed.  

## 2021-01-02 ENCOUNTER — Encounter: Payer: Self-pay | Admitting: Obstetrics & Gynecology

## 2021-01-02 ENCOUNTER — Telehealth: Payer: Self-pay | Admitting: *Deleted

## 2021-01-02 DIAGNOSIS — R87611 Atypical squamous cells cannot exclude high grade squamous intraepithelial lesion on cytologic smear of cervix (ASC-H): Secondary | ICD-10-CM | POA: Insufficient documentation

## 2021-01-02 DIAGNOSIS — N870 Mild cervical dysplasia: Secondary | ICD-10-CM | POA: Insufficient documentation

## 2021-01-02 LAB — SURGICAL PATHOLOGY

## 2021-01-02 NOTE — Telephone Encounter (Signed)
-----   Message from Osborne Oman, MD sent at 01/02/2021 11:15 AM EDT ----- CIN I diagnosed, needs repeat pap and HPV test in 12 months.  Please call to inform patient of results and recommendations.

## 2021-01-02 NOTE — Telephone Encounter (Signed)
Message sent to patient via my chart regarding her Colposcopy results and recommendations per Dr A.

## 2021-01-03 ENCOUNTER — Other Ambulatory Visit: Payer: Self-pay | Admitting: Physician Assistant

## 2021-01-03 DIAGNOSIS — E663 Overweight: Secondary | ICD-10-CM

## 2021-01-09 ENCOUNTER — Other Ambulatory Visit: Payer: Self-pay | Admitting: Physician Assistant

## 2021-01-09 DIAGNOSIS — E663 Overweight: Secondary | ICD-10-CM

## 2021-01-10 MED ORDER — PHENTERMINE HCL 15 MG PO CAPS: 15.0000 mg | ORAL_CAPSULE | ORAL | 0 refills | Status: DC

## 2021-01-10 NOTE — Telephone Encounter (Signed)
Did you want her to come back in 3 or 6 months?

## 2021-03-22 ENCOUNTER — Telehealth (INDEPENDENT_AMBULATORY_CARE_PROVIDER_SITE_OTHER): Payer: BC Managed Care – PPO | Admitting: Medical-Surgical

## 2021-03-22 ENCOUNTER — Encounter: Payer: Self-pay | Admitting: Medical-Surgical

## 2021-03-22 DIAGNOSIS — U071 COVID-19: Secondary | ICD-10-CM | POA: Diagnosis not present

## 2021-03-22 DIAGNOSIS — N3000 Acute cystitis without hematuria: Secondary | ICD-10-CM

## 2021-03-22 MED ORDER — MOLNUPIRAVIR EUA 200MG CAPSULE: 4.0000 | ORAL_CAPSULE | Freq: Two times a day (BID) | ORAL | 0 refills | Status: AC

## 2021-03-22 MED ORDER — NITROFURANTOIN MONOHYD MACRO 100 MG PO CAPS: 100.0000 mg | ORAL_CAPSULE | Freq: Two times a day (BID) | ORAL | 0 refills | Status: DC

## 2021-03-22 NOTE — Progress Notes (Signed)
Virtual Visit via Video Note  I connected with Rachael [name] on 03/22/21 at  9:10 AM EDT by a video enabled telemedicine application and verified that I am speaking with the correct person using two identifiers.   I discussed the limitations of evaluation and management by telemedicine and the availability of in person appointments. The patient expressed understanding and agreed to proceed.  Patient location: home Provider locations: office  Subjective:    CC: covid positive  HPI: Is an 34 year old female presenting today with reports of 2 days of congestion, weakness, fatigue, brain fog, headache, and temperature T-max 102.  She did test for COVID and had a positive result yesterday.  Has been using Tylenol and Alka-Seltzer cold/flu which do provide some help with symptoms but very temporarily.  Also notes that she has had some urinary symptoms starting on Saturday evening that include burning, frequency, and urgency.  No blood or odor noted in her urine.  No suprapubic pressure.  Denies nausea, vomiting, diarrhea, chest pain, shortness of breath, and sore throat.  Past medical history, Surgical history, Family history not pertinant except as noted below, Social history, Allergies, and medications have been entered into the medical record, reviewed, and corrections made.   Review of Systems: See HPI for pertinent positives and negatives.   Objective:    General: Speaking clearly in complete sentences without any shortness of breath.  Alert and oriented x3.  Normal judgment. No apparent acute distress.  Impression and Recommendations:    1. COVID-19 virus infection Discussed options for treating COVID-19 including symptomatic treatment versus adding an oral antiviral that is under emergency use authorization by the FDA.  Reviewed recommendations for supportive treatment for viral illnesses.  Patient would like to go ahead with oral antivirals are sending in Molnupiravir 800 mg twice daily  x5 days.  Reviewed symptoms that should prompt emergency/urgent care evaluation.  2.  Acute cystitis without hematuria Unable to test urine and unable to bring her in the office for urine sample/evaluation.  We will go ahead and treat empirically with nitrofurantoin 100 mg twice daily x5 days.  Continue to push p.o. fluids and practice good bladder hygiene.  I discussed the assessment and treatment plan with the patient. The patient was provided an opportunity to ask questions and all were answered. The patient agreed with the plan and demonstrated an understanding of the instructions.   The patient was advised to call back or seek an in-person evaluation if the symptoms worsen or if the condition fails to improve as anticipated.  20 minutes of non-face-to-face time was provided during this encounter.  Return if symptoms worsen or fail to improve.  Clearnce Sorrel, DNP, APRN, FNP-BC [facility]

## 2021-04-11 ENCOUNTER — Other Ambulatory Visit: Payer: Self-pay | Admitting: Physician Assistant

## 2021-04-11 DIAGNOSIS — E663 Overweight: Secondary | ICD-10-CM

## 2021-04-22 ENCOUNTER — Telehealth: Payer: BC Managed Care – PPO | Admitting: Nurse Practitioner

## 2021-04-22 DIAGNOSIS — J020 Streptococcal pharyngitis: Secondary | ICD-10-CM

## 2021-04-22 MED ORDER — AZITHROMYCIN 250 MG PO TABS: ORAL_TABLET | ORAL | 0 refills | Status: DC

## 2021-04-22 NOTE — Progress Notes (Signed)
Virtual Visit Consent   Rachael [name], you are scheduled for a virtual visit with a [facility] provider today.     Just as with appointments in the office, your consent must be obtained to participate.  Your consent will be active for this visit and any virtual visit you may have with one of our providers in the next 365 days.     If you have a MyChart account, a copy of this consent can be sent to you electronically.  All virtual visits are billed to your insurance company just like a traditional visit in the office.    As this is a virtual visit, video technology does not allow for your provider to perform a traditional examination.  This may limit your provider's ability to fully assess your condition.  If your provider identifies any concerns that need to be evaluated in person or the need to arrange testing (such as labs, EKG, etc.), we will make arrangements to do so.     Although advances in technology are sophisticated, we cannot ensure that it will always work on either your end or our end.  If the connection with a video visit is poor, the visit may have to be switched to a telephone visit.  With either a video or telephone visit, we are not always able to ensure that we have a secure connection.     I need to obtain your verbal consent now.   Are you willing to proceed with your visit today?    ZENDA DUNAGAN has provided verbal consent on 04/22/2021 for a virtual visit (video or telephone).   Apolonio Schneiders, FNP   Date: 04/22/2021 10:47 AM   Virtual Visit via Video Note   I, Apolonio Schneiders, connected with  Rachael [name]  ([id number], Aug 30, 1986) on 04/22/21 at 11:00 AM EDT by a video-enabled telemedicine application and verified that I am speaking with the correct person using two identifiers.  Location: Patient: Virtual Visit Location Patient: Home Provider: Virtual Visit Location Provider: Office/Clinic   I discussed the limitations of evaluation and management by telemedicine  and the availability of in person appointments. The patient expressed understanding and agreed to proceed.    History of Present Illness: Rachael [name] is a 35 y.o. who identifies as a female who was assigned female at birth, and is being seen today with complaints of a sore throat and it hurts to swallow and talk. She denies a fever. Denies any sick contacts. She has not taken a COVID test yet.   She has had COVID in the past, about one month ago.   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 No known exposure that she knows of, son had strep at the beginning of this month has finished antibiotics.   She denies nasal drainage at this time. She does have allergies.    Problems:  Patient Active Problem List   Diagnosis Date Noted   Atypical squamous cells cannot exclude high grade squamous intraepithelial lesion on cytologic smear of cervix (ASC-H) 01/02/2021   Dysplasia of cervix, low grade (CIN 1) 01/02/2021   Neck pain 10/11/2020   Frequent headaches 10/11/2020   High triglycerides 03/02/2020   Vitamin D insufficiency 03/02/2020   Acute bilateral low back pain with sciatica 11/02/2019   Word finding difficulty 07/10/2019   Medication side effect 07/10/2019   Skin mole 01/12/2018   Suspicious nevus 01/12/2018   Supervision of other normal pregnancy, antepartum 09/18/2017  Family hx of ovarian malignancy 04/03/2016   Overweight (BMI 25.0-29.9) 04/03/2016   Genetic testing 02/22/2015   Family history of breast cancer    Eustachian tube dysfunction 06/30/2014    No Known Allergies   Current Outpatient Medications  Medication Instructions   azithromycin (ZITHROMAX) 250 MG tablet Take 2 tablets on day 1, then 1 tablet daily on days 2 through 5   cetirizine (ZYRTEC) 10 mg, Daily   phentermine 15 mg, Oral, BH-each morning   topiramate (TOPAMAX) 50 MG tablet TAKE 1 TABLET BY MOUTH TWICE A DAY/ appt for further refills     Observations/Objective: Patient is  well-developed, well-nourished in no acute distress.  Resting comfortably at home.  Head is normocephalic, atraumatic.  No labored breathing.  Speech is clear and coherent with logical content.  Patient is alert and oriented at baseline.    Assessment and Plan: 1. Strep throat Potential strep exposure at home CENTR criteria used since visit is performed via Video    - azithromycin (ZITHROMAX) 250 MG tablet; Take 2 tablets on day 1, then 1 tablet daily on days 2 through 5  Dispense: 6 tablet; Refill: 0    May use OTC medication for support of sore throat, tylenol or advil as indicated/directed on package.   Push fluids rest, no indication for COVID testing has had infection I the past 30 days.  Follow Up Instructions: I discussed the assessment and treatment plan with the patient. The patient was provided an opportunity to ask questions and all were answered. The patient agreed with the plan and demonstrated an understanding of the instructions.  A copy of instructions were sent to the patient via MyChart.  The patient was advised to call back or seek an in-person evaluation if the symptoms worsen or if the condition fails to improve as anticipated.  Time:  I spent 15 minutes with the patient via telehealth technology discussing the above problems/concerns.    Apolonio Schneiders, FNP

## 2021-04-25 ENCOUNTER — Telehealth (INDEPENDENT_AMBULATORY_CARE_PROVIDER_SITE_OTHER): Payer: BC Managed Care – PPO | Admitting: Physician Assistant

## 2021-04-25 ENCOUNTER — Encounter: Payer: Self-pay | Admitting: Physician Assistant

## 2021-04-25 VITALS — BP 110/64 | Ht 66.0 in | Wt 169.0 lb

## 2021-04-25 DIAGNOSIS — E663 Overweight: Secondary | ICD-10-CM

## 2021-04-25 DIAGNOSIS — R635 Abnormal weight gain: Secondary | ICD-10-CM | POA: Diagnosis not present

## 2021-04-25 MED ORDER — TOPIRAMATE 50 MG PO TABS: ORAL_TABLET | ORAL | 3 refills | Status: DC

## 2021-04-25 MED ORDER — PHENTERMINE HCL 15 MG PO CAPS: 15.0000 mg | ORAL_CAPSULE | ORAL | 0 refills | Status: DC

## 2021-04-25 NOTE — Progress Notes (Signed)
..Virtual Visit via Video Note  I connected with Rachael [name] on 04/25/21 at  9:50 AM EDT by a video enabled telemedicine application and verified that I am speaking with the correct person using two identifiers.  Location: Patient: work Provider: clinic  .Marland KitchenParticipating in visit:  Patient: [name] Provider: Iran [name] PA-C    I discussed the limitations of evaluation and management by telemedicine and the availability of in person appointments. The patient expressed understanding and agreed to proceed.  History of Present Illness: Pt is a 35 yo overweight female who presents to the clinic for medication refill.   She is doing well on topamax and phentermine. No palpitations, headaches, constipation, dizziness, insomnia. She lost 4lbs over last 6 months but mostly she feels like medication helps her have some stability in her weight. She continues walking regularly and watching her caloric intake.   .. Active Ambulatory Problems    Diagnosis Date Noted   Eustachian tube dysfunction 06/30/2014   Family history of breast cancer    Genetic testing 02/22/2015   Family hx of ovarian malignancy 04/03/2016   Overweight (BMI 25.0-29.9) 04/03/2016   Supervision of other normal pregnancy, antepartum 09/18/2017   Skin mole 01/12/2018   Suspicious nevus 01/12/2018   Word finding difficulty 07/10/2019   Medication side effect 07/10/2019   Acute bilateral low back pain with sciatica 11/02/2019   High triglycerides 03/02/2020   Vitamin D insufficiency 03/02/2020   Neck pain 10/11/2020   Frequent headaches 10/11/2020   Atypical squamous cells cannot exclude high grade squamous intraepithelial lesion on cytologic smear of cervix (ASC-H) 01/02/2021   Dysplasia of cervix, low grade (CIN 1) 01/02/2021   Abnormal weight gain 04/25/2021   Resolved Ambulatory Problems    Diagnosis Date Noted   Allergy-induced asthma 06/30/2009   Allergic rhinitis 10/11/2011   Normal first pregnancy  confirmed, currently in third trimester 06/25/2014   SVD (spontaneous vaginal delivery) 06/26/2014   Pain in left ear 06/30/2014   Pyelectasis of fetus on prenatal ultrasound 12/05/2017   Normal labor 04/26/2018   SVD (spontaneous vaginal delivery) 04/26/2018   Class 1 obesity due to excess calories without serious comorbidity with body mass index (BMI) of 30.0 to 30.9 in adult 10/06/2018   Past Medical History:  Diagnosis Date   Allergy    Asthma        Observations/Objective: No acute distress Normal mood and appearance  .[name] Today's Vitals   04/25/21 0923  BP: 110/64  Weight: 169 lb (76.7 kg)  Height: '5\' 6"'$  (1.676 m)   Body mass index is 27.28 kg/m.    Assessment and Plan: Marland KitchenMarland KitchenShetara was seen today for medication follow up .  Diagnoses and all orders for this visit:  Overweight (BMI 25.0-29.9) -     topiramate (TOPAMAX) 50 MG tablet; TAKE 1 TABLET BY MOUTH TWICE A DAY. -     phentermine 15 MG capsule; Take 1 capsule (15 mg total) by mouth every morning.  Abnormal weight gain  BMI is 27. Pt has lost 4lbs in last 81month.  Continue to exercise at least 1511mutes a week and keep calories down to 1300.  Refilled for 3 months.  Follow up in 6 months.    Follow Up Instructions:    I discussed the assessment and treatment plan with the patient. The patient was provided an opportunity to ask questions and all were answered. The patient agreed with the plan and demonstrated an understanding of the instructions.   The patient was advised  to call back or seek an in-person evaluation if the symptoms worsen or if the condition fails to improve as anticipated.    Iran [name], PA-C

## 2021-04-25 NOTE — Progress Notes (Signed)
Patient would like to discuss Topamax and Phentermine.

## 2021-06-01 ENCOUNTER — Telehealth: Payer: Self-pay | Admitting: *Deleted

## 2021-06-01 NOTE — Telephone Encounter (Signed)
Returned call from 2:04 PM. Left patient a message to call office or MyChart message what she needs to be seen for.

## 2021-06-02 ENCOUNTER — Ambulatory Visit (INDEPENDENT_AMBULATORY_CARE_PROVIDER_SITE_OTHER): Payer: BC Managed Care – PPO | Admitting: Certified Nurse Midwife

## 2021-06-02 ENCOUNTER — Encounter: Payer: Self-pay | Admitting: Certified Nurse Midwife

## 2021-06-02 ENCOUNTER — Other Ambulatory Visit: Payer: Self-pay

## 2021-06-02 VITALS — BP 121/80 | HR 81 | Ht 66.0 in | Wt 176.0 lb

## 2021-06-02 DIAGNOSIS — Z30431 Encounter for routine checking of intrauterine contraceptive device: Secondary | ICD-10-CM

## 2021-06-02 NOTE — Progress Notes (Signed)
GYNECOLOGY OFFICE VISIT NOTE  History:  35 y.o. [id number] here today for IUD string check. States her husband can feel the string with IC over the last few weeks. Mirena IUD was placed on 12/01/20. She denies any abnormal vaginal discharge, pelvic pain or other concerns. She is very happy with this form of contraception.  Past Medical History:  Diagnosis Date   Allergy    Asthma    Family history of breast cancer    SVD (spontaneous vaginal delivery) 06/26/2014    Past Surgical History:  Procedure Laterality Date   DILATION AND CURETTAGE OF UTERUS N/A 05/20/2019   Procedure: DILATATION AND CURETTAGE;  Surgeon: Emily Filbert, MD;  Location: [place];  Service: Gynecology;  Laterality: N/A;   ENDOMETRIAL ABLATION N/A 05/20/2019   Procedure: ATTEMPTED MINERVA  ABLATION;  Surgeon: Emily Filbert, MD;  Location: [place];  Service: Gynecology;  Laterality: N/A;  minerva rep will be here confirmed on 05/15/19 CS   LAPAROSCOPIC BILATERAL SALPINGECTOMY N/A 05/20/2019   Procedure: LAPAROSCOPIC BILATERAL SALPINGECTOMY;  Surgeon: Emily Filbert, MD;  Location: [place];  Service: Gynecology;  Laterality: N/A;   WISDOM TOOTH EXTRACTION      The following portions of the patient's history were reviewed and updated as appropriate: allergies, current medications, past family history, past medical history, past social history, past surgical history and problem list.   Review of Systems:  Pertinent items noted in HPI and remainder of comprehensive ROS otherwise negative.  Objective:  Physical Exam BP 121/80   Pulse 81   Ht 5\' 6"  (1.676 m)   Wt 176 lb (79.8 kg)   BMI 28.41 kg/m  CONSTITUTIONAL: Well-developed, well-nourished female in no acute distress.  HENT:  Normocephalic, atraumatic.  SKIN: Skin is warm and dry. No rash noted. Not diaphoretic. No erythema. No pallor. NEUROLOGIC: Alert and oriented to person, place, and time.  PSYCHIATRIC: Normal mood  and affect. Normal behavior. Normal judgment and thought content. CARDIOVASCULAR: Normal heart rate noted RESPIRATORY: Effort and rate normal ABDOMEN: Soft, no distention noted.   PELVIC: Normal appearing external genitalia; normal appearing vaginal mucosa and cervix. IUD string seen and approx 4cm. Body of IUD not seen. No abnormal discharge noted. IUD strings trimmed to 2cm and pushed to left side of cervix.  Assessment & Plan:   1. IUD check up    Follow up if continues to have problems- consider tucking string inside cervical canal  Please refer to After Visit Summary for other counseling recommendations.   No follow-ups on file.   Julianne Handler, CNM 06/02/2021 10:04 AM

## 2021-06-20 ENCOUNTER — Encounter: Payer: Self-pay | Admitting: Physician Assistant

## 2021-06-20 ENCOUNTER — Telehealth (INDEPENDENT_AMBULATORY_CARE_PROVIDER_SITE_OTHER): Payer: BC Managed Care – PPO | Admitting: Physician Assistant

## 2021-06-20 VITALS — BP 122/92 | HR 95 | Wt 165.0 lb

## 2021-06-20 DIAGNOSIS — Z566 Other physical and mental strain related to work: Secondary | ICD-10-CM

## 2021-06-20 DIAGNOSIS — F419 Anxiety disorder, unspecified: Secondary | ICD-10-CM | POA: Diagnosis not present

## 2021-06-20 MED ORDER — HYDROXYZINE HCL 10 MG PO TABS: 10.0000 mg | ORAL_TABLET | Freq: Three times a day (TID) | ORAL | 0 refills | Status: DC | PRN

## 2021-06-20 NOTE — Progress Notes (Signed)
..Virtual Visit via Video Note  I connected with Rachael [name] on 06/20/21 at  9:10 AM EDT by a video enabled telemedicine application and verified that I am speaking with the correct person using two identifiers.  Location: Patient: work Provider: clinic  .Marland KitchenParticipating in visit:  Patient: Rachael [name] Provider: Iran [name] PA-C   I discussed the limitations of evaluation and management by telemedicine and the availability of in person appointments. The patient expressed understanding and agreed to proceed.  History of Present Illness: Pt is a 35 yo female who presents to the clinic to discuss worsening anxiety and stress. She has had intermittent anxiety and stress but seemed to be manageable. She feels like work is her stress culprit right now. She cut back on hours but her work increased. She feels like she is not getting things done and management is not helping. No SI/HC. She does not want to take something every day but more as needed. She tried stress J at [employer] but not noticed any improvement and worsened her reflux.   .. Active Ambulatory Problems    Diagnosis Date Noted   Eustachian tube dysfunction 06/30/2014   Family history of breast cancer    Genetic testing 02/22/2015   Family hx of ovarian malignancy 04/03/2016   Overweight (BMI 25.0-29.9) 04/03/2016   Supervision of other normal pregnancy, antepartum 09/18/2017   Skin mole 01/12/2018   Suspicious nevus 01/12/2018   Word finding difficulty 07/10/2019   Medication side effect 07/10/2019   Acute bilateral low back pain with sciatica 11/02/2019   High triglycerides 03/02/2020   Vitamin D insufficiency 03/02/2020   Neck pain 10/11/2020   Frequent headaches 10/11/2020   Atypical squamous cells cannot exclude high grade squamous intraepithelial lesion on cytologic smear of cervix (ASC-H) 01/02/2021   Dysplasia of cervix, low grade (CIN 1) 01/02/2021   Abnormal weight gain 04/25/2021   Anxiety 06/20/2021    Stress at work 06/20/2021   Resolved Ambulatory Problems    Diagnosis Date Noted   Allergy-induced asthma 06/30/2009   Allergic rhinitis 10/11/2011   Normal first pregnancy confirmed, currently in third trimester 06/25/2014   SVD (spontaneous vaginal delivery) 06/26/2014   Pain in left ear 06/30/2014   Pyelectasis of fetus on prenatal ultrasound 12/05/2017   Normal labor 04/26/2018   SVD (spontaneous vaginal delivery) 04/26/2018   Class 1 obesity due to excess calories without serious comorbidity with body mass index (BMI) of 30.0 to 30.9 in adult 10/06/2018   Past Medical History:  Diagnosis Date   Allergy    Asthma        Observations/Objective: No acute distress Normal mood and appearance.   .. Today's Vitals   06/20/21 0857  BP: (!) 122/92  Pulse: 95  Weight: 165 lb (74.8 kg)   Body mass index is 26.63 kg/m.  .. Depression screen [facility] 2/9 06/20/2021 07/04/2020 03/02/2020 10/03/2018 09/18/2018  Decreased Interest 0 0 0 0 0  Down, Depressed, Hopeless 1 0 0 0 0  PHQ - 2 Score 1 0 0 0 0  Altered sleeping 1 - 0 0 -  Tired, decreased energy 0 - 0 0 -  Change in appetite 0 - 0 0 -  Feeling bad or failure about yourself  0 - 0 0 -  Trouble concentrating 0 - 0 0 -  Moving slowly or fidgety/restless 0 - 0 0 -  Suicidal thoughts 0 - 0 0 -  PHQ-9 Score 2 - 0 0 -  Difficult doing work/chores  Somewhat difficult - Not difficult at all Not difficult at all -   .. GAD 7 : Generalized Anxiety Score 06/20/2021 03/02/2020 10/03/2018 09/18/2018  Nervous, Anxious, on Edge 1 0 0 0  Control/stop worrying 1 0 0 0  Worry too much - different things 1 0 0 0  Trouble relaxing 1 0 0 0  Restless 1 0 0 0  Easily annoyed or irritable 1 0 0 0  Afraid - awful might happen 0 0 0 0  Total GAD 7 Score 6 0 0 0  Anxiety Difficulty Somewhat difficult Not difficult at all Not difficult at all Not difficult at all   .[name] Depression screen [facility] 2/9 06/20/2021 07/04/2020 03/02/2020 10/03/2018 09/18/2018   Decreased Interest 0 0 0 0 0  Down, Depressed, Hopeless 1 0 0 0 0  PHQ - 2 Score 1 0 0 0 0  Altered sleeping 1 - 0 0 -  Tired, decreased energy 0 - 0 0 -  Change in appetite 0 - 0 0 -  Feeling bad or failure about yourself  0 - 0 0 -  Trouble concentrating 0 - 0 0 -  Moving slowly or fidgety/restless 0 - 0 0 -  Suicidal thoughts 0 - 0 0 -  PHQ-9 Score 2 - 0 0 -  Difficult doing work/chores Somewhat difficult - Not difficult at all Not difficult at all -     Assessment and Plan: Marland KitchenMarland KitchenAvalie was seen today for anxiety and stress.  Diagnoses and all orders for this visit:  Anxiety -     hydrOXYzine (ATARAX/VISTARIL) 10 MG tablet; Take 1 tablet (10 mg total) by mouth 3 (three) times daily as needed.  Stress at work  Discussed behavioral ways and conversations to have to decrease work stress.  Encouraged self care and regular exercise.  Hydroxyzine given to use as needed.  I think it could be time for SSRI or SSNRI. Pt does not want to take daily medication at this time.  Follow up in 1 month.    Follow Up Instructions:    I discussed the assessment and treatment plan with the patient. The patient was provided an opportunity to ask questions and all were answered. The patient agreed with the plan and demonstrated an understanding of the instructions.   The patient was advised to call back or seek an in-person evaluation if the symptoms worsen or if the condition fails to improve as anticipated.     Iran [name], PA-C

## 2021-07-11 ENCOUNTER — Encounter: Payer: Self-pay | Admitting: Physician Assistant

## 2021-07-11 MED ORDER — SERTRALINE HCL 50 MG PO TABS: 50.0000 mg | ORAL_TABLET | Freq: Every day | ORAL | 1 refills | Status: DC

## 2021-07-12 ENCOUNTER — Other Ambulatory Visit: Payer: Self-pay | Admitting: Physician Assistant

## 2021-07-12 DIAGNOSIS — F419 Anxiety disorder, unspecified: Secondary | ICD-10-CM

## 2021-08-02 ENCOUNTER — Other Ambulatory Visit: Payer: Self-pay | Admitting: Physician Assistant

## 2021-08-11 ENCOUNTER — Telehealth (INDEPENDENT_AMBULATORY_CARE_PROVIDER_SITE_OTHER): Payer: BC Managed Care – PPO | Admitting: Physician Assistant

## 2021-08-11 ENCOUNTER — Encounter: Payer: Self-pay | Admitting: Physician Assistant

## 2021-08-11 VITALS — BP 122/80 | Ht 66.0 in | Wt 165.0 lb

## 2021-08-11 DIAGNOSIS — Z566 Other physical and mental strain related to work: Secondary | ICD-10-CM

## 2021-08-11 DIAGNOSIS — Z79899 Other long term (current) drug therapy: Secondary | ICD-10-CM | POA: Diagnosis not present

## 2021-08-11 DIAGNOSIS — E663 Overweight: Secondary | ICD-10-CM

## 2021-08-11 DIAGNOSIS — F419 Anxiety disorder, unspecified: Secondary | ICD-10-CM

## 2021-08-11 MED ORDER — PHENTERMINE HCL 15 MG PO CAPS: 15.0000 mg | ORAL_CAPSULE | ORAL | 0 refills | Status: DC

## 2021-08-11 MED ORDER — SERTRALINE HCL 50 MG PO TABS: 50.0000 mg | ORAL_TABLET | Freq: Every day | ORAL | 1 refills | Status: DC

## 2021-08-11 NOTE — Progress Notes (Signed)
..Virtual Visit via Video Note  I connected with Rachael [name] on 08/11/21 at 10:50 AM EST by a video enabled telemedicine application and verified that I am speaking with the correct person using two identifiers.  Location: Patient: work Provider: clinic  .Marland KitchenParticipating in visit:  Patient: Rachael [name] Provider: Iran [name] PA-C Provider in training: Danny Lawless PA-S   I discussed the limitations of evaluation and management by telemedicine and the availability of in person appointments. The patient expressed understanding and agreed to proceed.  History of Present Illness: Pt is a 35 yo female who presents to the clinic to follow up on anxiety and stress. She started on hydroxizine but did not notice any difference. She then started on zoloft and helping a lot. She is doing really well. Her kids have even commented. She is tolerating zoloft well as well. No SI/HC.    [name]. Active Ambulatory Problems    Diagnosis Date Noted   Eustachian tube dysfunction 06/30/2014   Family history of breast cancer    Genetic testing 02/22/2015   Family hx of ovarian malignancy 04/03/2016   Overweight (BMI 25.0-29.9) 04/03/2016   Supervision of other normal pregnancy, antepartum 09/18/2017   Skin mole 01/12/2018   Suspicious nevus 01/12/2018   Word finding difficulty 07/10/2019   Medication side effect 07/10/2019   Acute bilateral low back pain with sciatica 11/02/2019   High triglycerides 03/02/2020   Vitamin D insufficiency 03/02/2020   Neck pain 10/11/2020   Frequent headaches 10/11/2020   Atypical squamous cells cannot exclude high grade squamous intraepithelial lesion on cytologic smear of cervix (ASC-H) 01/02/2021   Dysplasia of cervix, low grade (CIN 1) 01/02/2021   Abnormal weight gain 04/25/2021   Anxiety 06/20/2021   Stress at work 06/20/2021   Resolved Ambulatory Problems    Diagnosis Date Noted   Allergy-induced asthma 06/30/2009   Allergic rhinitis 10/11/2011   Normal first  pregnancy confirmed, currently in third trimester 06/25/2014   SVD (spontaneous vaginal delivery) 06/26/2014   Pain in left ear 06/30/2014   Pyelectasis of fetus on prenatal ultrasound 12/05/2017   Normal labor 04/26/2018   SVD (spontaneous vaginal delivery) 04/26/2018   Class 1 obesity due to excess calories without serious comorbidity with body mass index (BMI) of 30.0 to 30.9 in adult 10/06/2018   Past Medical History:  Diagnosis Date   Allergy    Asthma     Observations/Objective: No acute distress Normal mood and appearance No labored breathing  .[name] Today's Vitals   08/11/21 1031  BP: 122/80  Weight: 165 lb (74.8 kg)  Height: 5\' 6"  (1.676 m)   Body mass index is 26.63 kg/m.    .. Depression screen [facility] 2/9 08/11/2021 06/20/2021 07/04/2020 03/02/2020 10/03/2018  Decreased Interest 0 0 0 0 0  Down, Depressed, Hopeless 0 1 0 0 0  PHQ - 2 Score 0 1 0 0 0  Altered sleeping 0 1 - 0 0  Tired, decreased energy 0 0 - 0 0  Change in appetite 0 0 - 0 0  Feeling bad or failure about yourself  0 0 - 0 0  Trouble concentrating 0 0 - 0 0  Moving slowly or fidgety/restless 0 0 - 0 0  Suicidal thoughts 0 0 - 0 0  PHQ-9 Score 0 2 - 0 0  Difficult doing work/chores Not difficult at all Somewhat difficult - Not difficult at all Not difficult at all   .[name] GAD 7 : Generalized Anxiety Score 08/11/2021 06/20/2021 03/02/2020 10/03/2018  Nervous, Anxious, on Edge 0 1 0 0  Control/stop worrying 0 1 0 0  Worry too much - different things 0 1 0 0  Trouble relaxing 0 1 0 0  Restless 0 1 0 0  Easily annoyed or irritable 0 1 0 0  Afraid - awful might happen 0 0 0 0  Total GAD 7 Score 0 6 0 0  Anxiety Difficulty Not difficult at all Somewhat difficult Not difficult at all Not difficult at all     Assessment and Plan: Marland KitchenMarland KitchenRashida was seen today for follow-up.  Diagnoses and all orders for this visit:  Anxiety -     sertraline (ZOLOFT) 50 MG tablet; Take 1 tablet (50 mg total) by mouth  daily.  Overweight (BMI 25.0-29.9) -     phentermine 15 MG capsule; Take 1 capsule (15 mg total) by mouth every morning.  Stress at work -     sertraline (ZOLOFT) 50 MG tablet; Take 1 tablet (50 mg total) by mouth daily.  Medication management -     COMPLETE METABOLIC PANEL WITH GFR   PHQ and GAD are GREAT.  Continue same dose of zoloft.  Check CMP.  Goal for stabilization is 6 month to 1 year.  Follow up in 6 months.  Continue phentermine for weight loss low dose.   Follow Up Instructions:    I discussed the assessment and treatment plan with the patient. The patient was provided an opportunity to ask questions and all were answered. The patient agreed with the plan and demonstrated an understanding of the instructions.   The patient was advised to call back or seek an in-person evaluation if the symptoms worsen or if the condition fails to improve as anticipated.    Iran [name], PA-C

## 2021-08-24 ENCOUNTER — Other Ambulatory Visit: Payer: Self-pay

## 2021-08-24 ENCOUNTER — Encounter: Payer: Self-pay | Admitting: Physician Assistant

## 2021-08-24 ENCOUNTER — Encounter: Payer: Self-pay | Admitting: Medical-Surgical

## 2021-08-24 ENCOUNTER — Ambulatory Visit (INDEPENDENT_AMBULATORY_CARE_PROVIDER_SITE_OTHER): Payer: BC Managed Care – PPO | Admitting: Medical-Surgical

## 2021-08-24 VITALS — BP 111/73 | HR 80 | Resp 20 | Ht 66.0 in | Wt 181.0 lb

## 2021-08-24 DIAGNOSIS — H109 Unspecified conjunctivitis: Secondary | ICD-10-CM

## 2021-08-24 DIAGNOSIS — J019 Acute sinusitis, unspecified: Secondary | ICD-10-CM | POA: Diagnosis not present

## 2021-08-24 MED ORDER — FLUCONAZOLE 150 MG PO TABS: 150.0000 mg | ORAL_TABLET | Freq: Once | ORAL | 0 refills | Status: AC

## 2021-08-24 MED ORDER — OFLOXACIN 0.3 % OP SOLN: 1.0000 [drp] | Freq: Four times a day (QID) | OPHTHALMIC | 0 refills | Status: DC

## 2021-08-24 MED ORDER — AMOXICILLIN-POT CLAVULANATE 875-125 MG PO TABS: 1.0000 | ORAL_TABLET | Freq: Two times a day (BID) | ORAL | 0 refills | Status: DC

## 2021-08-24 NOTE — Progress Notes (Signed)
°  HPI with pertinent ROS:   CC: Eye redness, cold symptoms x1 week  HPI: Pleasant 35 year old female presenting today with new onset of right eye redness that she noticed this morning.  She has had upper respiratory/cold symptoms for about a week now and has had sinus congestion.  Notes that she had matting of her eyelashes this morning and that the secretions were greenish-yellow and crusty.  She does wear contacts but is currently wearing glasses.  Notes that one of her family members had pinkeye a week and a half ago and wonders if that is what is going on with her.  Denies fever, chills, shortness of breath, chest pain, sore throat, cough, and GI symptoms.  I reviewed the past medical history, family history, social history, surgical history, and allergies today and no changes were needed.  Please see the problem list section below in epic for further details.   Physical exam:   General: Well Developed, well nourished, and in no acute distress.  Neuro: Alert and oriented x3.  HEENT: Normocephalic, atraumatic.  Skin: Warm and dry. Cardiac: Regular rate and rhythm, no murmurs rubs or gallops, no lower extremity edema.  Respiratory: Clear to auscultation bilaterally. Not using accessory muscles, speaking in full sentences.  Impression and Recommendations:    1. Bacterial conjunctivitis With purulent discharge and matting this morning, we will go and treat for bacterial conjunctivitis.  Sending in ofloxacin eyedrops 4 times daily for 7 days.  2. Acute non-recurrent sinusitis, unspecified location Augmentin twice daily x7 days.  Sending in [place] for vulvovaginal candidiasis prophylaxis.  Return if symptoms worsen or fail to improve. ___________________________________________ Clearnce Sorrel, DNP, APRN, FNP-BC Primary Care and [place]

## 2021-09-25 ENCOUNTER — Emergency Department: Admit: 2021-09-25 | Discharge status: Absent without leave | Payer: Self-pay

## 2021-09-25 ENCOUNTER — Encounter: Payer: Self-pay | Admitting: Physician Assistant

## 2021-09-25 ENCOUNTER — Ambulatory Visit (INDEPENDENT_AMBULATORY_CARE_PROVIDER_SITE_OTHER): Payer: BC Managed Care – PPO | Admitting: Physician Assistant

## 2021-09-25 ENCOUNTER — Other Ambulatory Visit: Payer: Self-pay

## 2021-09-25 VITALS — BP 127/72 | HR 81 | Temp 97.6°F | Ht 66.0 in | Wt 182.0 lb

## 2021-09-25 DIAGNOSIS — R509 Fever, unspecified: Secondary | ICD-10-CM | POA: Diagnosis not present

## 2021-09-25 DIAGNOSIS — Z20818 Contact with and (suspected) exposure to other bacterial communicable diseases: Secondary | ICD-10-CM

## 2021-09-25 DIAGNOSIS — J029 Acute pharyngitis, unspecified: Secondary | ICD-10-CM | POA: Diagnosis not present

## 2021-09-25 DIAGNOSIS — R051 Acute cough: Secondary | ICD-10-CM

## 2021-09-25 DIAGNOSIS — J329 Chronic sinusitis, unspecified: Secondary | ICD-10-CM | POA: Diagnosis not present

## 2021-09-25 DIAGNOSIS — R062 Wheezing: Secondary | ICD-10-CM

## 2021-09-25 DIAGNOSIS — J40 Bronchitis, not specified as acute or chronic: Secondary | ICD-10-CM

## 2021-09-25 DIAGNOSIS — R52 Pain, unspecified: Secondary | ICD-10-CM | POA: Diagnosis not present

## 2021-09-25 LAB — POCT INFLUENZA A/B
Influenza A, POC: NEGATIVE
Influenza B, POC: NEGATIVE

## 2021-09-25 LAB — POCT RAPID STREP A (OFFICE): Rapid Strep A Screen: NEGATIVE

## 2021-09-25 MED ORDER — PREDNISONE 20 MG PO TABS
20.0000 mg | ORAL_TABLET | Freq: Two times a day (BID) | ORAL | 0 refills | Status: DC
Start: 2021-09-25 — End: 2022-01-31

## 2021-09-25 MED ORDER — AZITHROMYCIN 250 MG PO TABS: ORAL_TABLET | ORAL | 0 refills | Status: DC

## 2021-09-25 MED ORDER — BENZONATATE 200 MG PO CAPS: 200.0000 mg | ORAL_CAPSULE | Freq: Three times a day (TID) | ORAL | 0 refills | Status: DC | PRN

## 2021-09-25 NOTE — Progress Notes (Signed)
Subjective:    Patient ID: Rachael [name], female    DOB: June 22, 1986, 36 y.o.   MRN: [id number]  HPI Pt is a 36 yo female who presents to the clinic with fever, body aches, cough, chest tightness, ST, headache since Friday night. Her son and daughter both had strep. She has tested negative for covid with home test. No other sick exposures. She is taking OTC medications to help with symptoms. Highest fever was 103.2. she does have some productive cough and chest tightness.   .. Active Ambulatory Problems    Diagnosis Date Noted   Eustachian tube dysfunction 06/30/2014   Family history of breast cancer    Genetic testing 02/22/2015   Family hx of ovarian malignancy 04/03/2016   Overweight (BMI 25.0-29.9) 04/03/2016   Supervision of other normal pregnancy, antepartum 09/18/2017   Skin mole 01/12/2018   Suspicious nevus 01/12/2018   Medication side effect 07/10/2019   Acute bilateral low back pain with sciatica 11/02/2019   High triglycerides 03/02/2020   Vitamin D insufficiency 03/02/2020   Neck pain 10/11/2020   Frequent headaches 10/11/2020   Atypical squamous cells cannot exclude high grade squamous intraepithelial lesion on cytologic smear of cervix (ASC-H) 01/02/2021   Dysplasia of cervix, low grade (CIN 1) 01/02/2021   Abnormal weight gain 04/25/2021   Anxiety 06/20/2021   Stress at work 06/20/2021   Body aches 09/25/2021   Sore throat 09/25/2021   Resolved Ambulatory Problems    Diagnosis Date Noted   Allergy-induced asthma 06/30/2009   Allergic rhinitis 10/11/2011   Normal first pregnancy confirmed, currently in third trimester 06/25/2014   SVD (spontaneous vaginal delivery) 06/26/2014   Pain in left ear 06/30/2014   Pyelectasis of fetus on prenatal ultrasound 12/05/2017   Normal labor 04/26/2018   SVD (spontaneous vaginal delivery) 04/26/2018   Class 1 obesity due to excess calories without serious comorbidity with body mass index (BMI) of 30.0 to 30.9 in adult  10/06/2018   Past Medical History:  Diagnosis Date   Allergy    Asthma        Review of Systems    See HPI.  Objective:   Physical Exam Vitals reviewed.  Constitutional:      Appearance: Normal appearance.  HENT:     Head: Normocephalic.     Right Ear: Tympanic membrane and external ear normal. There is no impacted cerumen.     Left Ear: Tympanic membrane and external ear normal. There is no impacted cerumen.     Nose: Nose normal.     Mouth/Throat:     Mouth: Mucous membranes are moist.     Pharynx: Posterior oropharyngeal erythema present. No oropharyngeal exudate.     Comments: Swollen uvula Erythematous and large bilateral tonsils Eyes:     Conjunctiva/sclera: Conjunctivae normal.  Cardiovascular:     Rate and Rhythm: Normal rate and regular rhythm.     Pulses: Normal pulses.  Pulmonary:     Effort: Pulmonary effort is normal.     Breath sounds: Wheezing present.  Lymphadenopathy:     Cervical: Cervical adenopathy present.  Neurological:     General: No focal deficit present.     Mental Status: She is alert.  Psychiatric:        Mood and Affect: Mood normal.  .. Results for orders placed or performed in visit on 09/25/21  POCT Influenza A/B  Result Value Ref Range   Influenza A, POC Negative Negative   Influenza B, POC Negative Negative  POCT rapid strep A  Result Value Ref Range   Rapid Strep A Screen Negative Negative           Assessment & Plan:  Marland KitchenMarland KitchenDustyn was seen today for cough.  Diagnoses and all orders for this visit:  Sinobronchitis -     predniSONE (DELTASONE) 20 MG tablet; Take 1 tablet (20 mg total) by mouth 2 (two) times daily with a meal. -     azithromycin (ZITHROMAX Z-PAK) 250 MG tablet; Take 2 tablets (500 mg) on  Day 1,  followed by 1 tablet (250 mg) once daily on Days 2 through 5.  Fever, unspecified fever cause -     POCT Influenza A/B -     POCT rapid strep A -     Novel Coronavirus, NAA ([facility])  Sore throat -      POCT Influenza A/B -     POCT rapid strep A  Body aches -     POCT Influenza A/B -     POCT rapid strep A  Exposure to strep throat -     POCT Influenza A/B -     POCT rapid strep A  Acute cough -     POCT Influenza A/B -     POCT rapid strep A -     benzonatate (TESSALON) 200 MG capsule; Take 1 capsule (200 mg total) by mouth 3 (three) times daily as needed for cough.  Wheezing -     predniSONE (DELTASONE) 20 MG tablet; Take 1 tablet (20 mg total) by mouth 2 (two) times daily with a meal.   Negative for flu and strep despite exposure Home negative covid test Will order PCR covid test today Likely more viral infection with possible strep due to exposure Start prednisone, albuterol, tessalon pearls. If no significant improvement start zpak in next 24-48 hours.  Rest and hydrate Stay out of work until PCR covid test returns.

## 2021-09-26 ENCOUNTER — Encounter: Payer: Self-pay | Admitting: Physician Assistant

## 2021-09-26 LAB — SARS-COV-2, NAA 2 DAY TAT

## 2021-09-26 LAB — NOVEL CORONAVIRUS, NAA: SARS-CoV-2, NAA: NOT DETECTED

## 2021-09-26 NOTE — Progress Notes (Signed)
Negative for covid. If not ran a fever for over 24 hours may go back to work tomorrow.

## 2021-11-09 ENCOUNTER — Encounter: Payer: Self-pay | Admitting: Physician Assistant

## 2021-11-10 MED ORDER — VALACYCLOVIR HCL 1 G PO TABS: 2000.0000 mg | ORAL_TABLET | Freq: Two times a day (BID) | ORAL | 3 refills | Status: DC

## 2021-12-01 ENCOUNTER — Other Ambulatory Visit: Payer: Self-pay | Admitting: Physician Assistant

## 2021-12-01 DIAGNOSIS — Z566 Other physical and mental strain related to work: Secondary | ICD-10-CM

## 2021-12-01 DIAGNOSIS — F419 Anxiety disorder, unspecified: Secondary | ICD-10-CM

## 2022-01-05 ENCOUNTER — Other Ambulatory Visit: Payer: Self-pay | Admitting: Physician Assistant

## 2022-01-05 DIAGNOSIS — Z566 Other physical and mental strain related to work: Secondary | ICD-10-CM

## 2022-01-05 DIAGNOSIS — F419 Anxiety disorder, unspecified: Secondary | ICD-10-CM

## 2022-01-10 ENCOUNTER — Other Ambulatory Visit: Payer: Self-pay | Admitting: Physician Assistant

## 2022-01-10 DIAGNOSIS — E663 Overweight: Secondary | ICD-10-CM

## 2022-01-31 ENCOUNTER — Ambulatory Visit (INDEPENDENT_AMBULATORY_CARE_PROVIDER_SITE_OTHER): Payer: BC Managed Care – PPO | Admitting: Sports Medicine

## 2022-01-31 ENCOUNTER — Ambulatory Visit (INDEPENDENT_AMBULATORY_CARE_PROVIDER_SITE_OTHER): Payer: BC Managed Care – PPO

## 2022-01-31 DIAGNOSIS — M25561 Pain in right knee: Secondary | ICD-10-CM

## 2022-01-31 DIAGNOSIS — M2240 Chondromalacia patellae, unspecified knee: Secondary | ICD-10-CM | POA: Diagnosis not present

## 2022-01-31 DIAGNOSIS — M25562 Pain in left knee: Secondary | ICD-10-CM

## 2022-01-31 MED ORDER — MELOXICAM 15 MG PO TABS: ORAL_TABLET | ORAL | 3 refills | Status: AC

## 2022-01-31 NOTE — Progress Notes (Signed)
    Procedures performed today:    None.  Independent interpretation of notes and tests performed by another provider:   None.  Brief History, Exam, Impression, and Recommendations:    Chondromalacia of patellofemoral joint Pleasant 36 year old female, long history of knee pain, now having worsening pain bilateral right worse than left localized under the patella, positive patellar crepitus and patellar compression. We discussed the pathophysiology, adding x-rays, meloxicam, home physical therapy, she will work with her PCP on weight loss. Return to see [place] in 6 weeks, injection if not better.  Current process with exacerbation and pharmacologic intervention  ___________________________________________ Gwen Her. Dianah Field, M.D., ABFM., CAQSM. Primary Care and [place] Instructor of [employer] [facility]

## 2022-01-31 NOTE — Assessment & Plan Note (Signed)
Pleasant 36 year old female, long history of knee pain, now having worsening pain bilateral right worse than left localized under the patella, positive patellar crepitus and patellar compression. We discussed the pathophysiology, adding x-rays, meloxicam, home physical therapy, she will work with her PCP on weight loss. Return to see [place] in 6 weeks, injection if not better.

## 2022-02-16 ENCOUNTER — Encounter: Payer: Self-pay | Admitting: Physician Assistant

## 2022-02-20 ENCOUNTER — Telehealth (INDEPENDENT_AMBULATORY_CARE_PROVIDER_SITE_OTHER): Payer: BC Managed Care – PPO | Admitting: Physician Assistant

## 2022-02-20 ENCOUNTER — Encounter: Payer: Self-pay | Admitting: Physician Assistant

## 2022-02-20 VITALS — BP 116/80 | Ht 66.0 in | Wt 185.0 lb

## 2022-02-20 DIAGNOSIS — E663 Overweight: Secondary | ICD-10-CM

## 2022-02-20 DIAGNOSIS — E559 Vitamin D deficiency, unspecified: Secondary | ICD-10-CM

## 2022-02-20 DIAGNOSIS — Z131 Encounter for screening for diabetes mellitus: Secondary | ICD-10-CM

## 2022-02-20 DIAGNOSIS — M2241 Chondromalacia patellae, right knee: Secondary | ICD-10-CM

## 2022-02-20 DIAGNOSIS — Z79899 Other long term (current) drug therapy: Secondary | ICD-10-CM | POA: Diagnosis not present

## 2022-02-20 DIAGNOSIS — E781 Pure hyperglyceridemia: Secondary | ICD-10-CM | POA: Diagnosis not present

## 2022-02-20 MED ORDER — WEGOVY 1 MG/0.5ML ~~LOC~~ SOAJ: 1.0000 mg | SUBCUTANEOUS | 0 refills | Status: DC

## 2022-02-20 MED ORDER — WEGOVY 0.5 MG/0.5ML ~~LOC~~ SOAJ: 0.5000 mg | SUBCUTANEOUS | 0 refills | Status: DC

## 2022-02-20 MED ORDER — WEGOVY 0.25 MG/0.5ML ~~LOC~~ SOAJ: 0.2500 mg | SUBCUTANEOUS | 0 refills | Status: DC

## 2022-02-20 NOTE — Progress Notes (Unsigned)
..Virtual Visit via Video Note  I connected with [name] on 02/21/22 at  1:20 PM EDT by a video enabled telemedicine application and verified that I am speaking with the correct person using two identifiers.  Location: Patient: work Provider: clinic  .Marland KitchenParticipating in visit:  Patient: Rachael [name] Provider: Iran [name] PA-C   I discussed the limitations of evaluation and management by telemedicine and the availability of in person appointments. The patient expressed understanding and agreed to proceed.  History of Present Illness: Pt is a 36 yo female who would like to discuss medication for weight loss. She has been on phentermine and topamax and really just staying the same weight or evening slowly gaining. Her knees have started giving her trouble and Dr. Darene Lamer suggested weight loss medication. She has actively been trying to lose weight with exercise and diet off and on for many years. She has tried and been off and on weight loss medication.    .. Active Ambulatory Problems    Diagnosis Date Noted   Eustachian tube dysfunction 06/30/2014   Family history of breast cancer    Genetic testing 02/22/2015   Family hx of ovarian malignancy 04/03/2016   Overweight (BMI 25.0-29.9) 04/03/2016   Supervision of other normal pregnancy, antepartum 09/18/2017   Skin mole 01/12/2018   Suspicious nevus 01/12/2018   Medication side effect 07/10/2019   Acute bilateral low back pain with sciatica 11/02/2019   High triglycerides 03/02/2020   Vitamin D insufficiency 03/02/2020   Neck pain 10/11/2020   Frequent headaches 10/11/2020   Atypical squamous cells cannot exclude high grade squamous intraepithelial lesion on cytologic smear of cervix (ASC-H) 01/02/2021   Dysplasia of cervix, low grade (CIN 1) 01/02/2021   Abnormal weight gain 04/25/2021   Anxiety 06/20/2021   Stress at work 06/20/2021   Body aches 09/25/2021   Sore throat 09/25/2021   Chondromalacia of patellofemoral joint 01/31/2022    Resolved Ambulatory Problems    Diagnosis Date Noted   Allergy-induced asthma 06/30/2009   Allergic rhinitis 10/11/2011   Normal first pregnancy confirmed, currently in third trimester 06/25/2014   SVD (spontaneous vaginal delivery) 06/26/2014   Pain in left ear 06/30/2014   Pyelectasis of fetus on prenatal ultrasound 12/05/2017   Normal labor 04/26/2018   SVD (spontaneous vaginal delivery) 04/26/2018   Class 1 obesity due to excess calories without serious comorbidity with body mass index (BMI) of 30.0 to 30.9 in adult 10/06/2018   Past Medical History:  Diagnosis Date   Allergy    Asthma     Observations/Objective: No acute changes Normal mood and appearance  .[name] Today's Vitals   02/20/22 1307  BP: 116/80  Weight: 185 lb (83.9 kg)  Height: '5\' 6"'$  (1.676 m)   Body mass index is 29.86 kg/m.    Assessment and Plan: Marland KitchenMarland KitchenDyanne was seen today for medication management.  Diagnoses and all orders for this visit:  Overweight (BMI 25.0-29.9) -     WEGOVY 0.25 MG/0.5ML SOAJ; Inject 0.25 mg into the skin once a week. Use this dose for 1 month (4 shots) and then increase to next higher dose. -     WEGOVY 0.5 MG/0.5ML SOAJ; Inject 0.5 mg into the skin once a week. Use this dose for 1 month (4 shots) and then increase to next higher dose. -     WEGOVY 1 MG/0.5ML SOAJ; Inject 1 mg into the skin once a week. Use this dose for 1 month (4 shots) and then increase to next higher  dose.  Medication management -     TSH -     Lipid Panel w/reflex Direct LDL -     COMPLETE METABOLIC PANEL WITH GFR -     CBC with Differential/Platelet -     Vitamin D (25 hydroxy)  High triglycerides -     Lipid Panel w/reflex Direct LDL  Vitamin D insufficiency -     Vitamin D (25 hydroxy)  Diabetes mellitus screening -     COMPLETE METABOLIC PANEL WITH GFR  Chondromalacia of right patellofemoral joint -     WEGOVY 0.25 MG/0.5ML SOAJ; Inject 0.25 mg into the skin once a week. Use this dose for  1 month (4 shots) and then increase to next higher dose. -     WEGOVY 0.5 MG/0.5ML SOAJ; Inject 0.5 mg into the skin once a week. Use this dose for 1 month (4 shots) and then increase to next higher dose. -     WEGOVY 1 MG/0.5ML SOAJ; Inject 1 mg into the skin once a week. Use this dose for 1 month (4 shots) and then increase to next higher dose.   I think starting wegovy with patients history of obesity and now bilateral knee chondromalica and pain is a great idea. Discussed medication, side effects and how to titrate up.  Continue to make good diet choices and keep active.  Follow up in 3 months.    Follow Up Instructions:    I discussed the assessment and treatment plan with the patient. The patient was provided an opportunity to ask questions and all were answered. The patient agreed with the plan and demonstrated an understanding of the instructions.   The patient was advised to call back or seek an in-person evaluation if the symptoms worsen or if the condition fails to improve as anticipated.    Iran [name], PA-C

## 2022-02-20 NOTE — Progress Notes (Unsigned)
Wants to discuss weight loss medications Having knee problems, was told to make a follow up to discuss per Dr. Darene Lamer.  Has tried phentermine in the past, wants to discuss options

## 2022-02-21 ENCOUNTER — Encounter: Payer: Self-pay | Admitting: Physician Assistant

## 2022-03-01 MED ORDER — SAXENDA 18 MG/3ML ~~LOC~~ SOPN: 3.0000 mg | PEN_INJECTOR | Freq: Every day | SUBCUTANEOUS | 0 refills | Status: DC

## 2022-03-01 MED ORDER — INSULIN PEN NEEDLE 31G X 6 MM MISC
1 refills | Status: DC
Start: 2022-03-01 — End: 2022-03-09

## 2022-03-02 NOTE — Telephone Encounter (Signed)
Donella Stade, PA-C  You; Shaune Pascal H, CMA; Feaster, Donata Duff, Keyairea A, CMA 18 hours ago (1:53 PM)    Can we call the drug rep about some low dose wegovy samples to get people to where they can get from pharmacy?     I spoke with rep last week and she said they are recommending not starting anyone right now because of the backorder issue. Patients will just have to call around to see who has this in stock.

## 2022-03-07 ENCOUNTER — Telehealth: Payer: Self-pay

## 2022-03-07 NOTE — Telephone Encounter (Signed)
Initiated Prior authorization XJD:[id number] '18MG'$ /3ML pen-injectors Via: Covermymeds Case/Key:[id number] Status: approved as of 03/07/22 Reason:Plan exclusion, non formulary  Weight loss medications are not covered by pt's current benefit plan.This medication is approved at the total cost  of the medication.  Notified Pt via: Mychart

## 2022-03-07 NOTE — Telephone Encounter (Signed)
Please check on saxenda approval and cost for patient.

## 2022-03-09 MED ORDER — PHENTERMINE HCL 15 MG PO CAPS: 15.0000 mg | ORAL_CAPSULE | ORAL | 0 refills | Status: DC

## 2022-03-09 NOTE — Addendum Note (Signed)
Addended by: Donella Stade on: 03/09/2022 04:20 PM   Modules accepted: Orders

## 2022-03-14 ENCOUNTER — Ambulatory Visit (INDEPENDENT_AMBULATORY_CARE_PROVIDER_SITE_OTHER): Payer: BC Managed Care – PPO | Admitting: Sports Medicine

## 2022-03-14 DIAGNOSIS — M2241 Chondromalacia patellae, right knee: Secondary | ICD-10-CM

## 2022-03-14 NOTE — Assessment & Plan Note (Signed)
Very pleasant 36 year old female, long history of anterior knee pain, right worse than left, she had positive patellar crepitus and painful patellar compression at the last visit. We added x-rays, meloxicam, home PT. Symptoms are only a touch better, she is inconsistent with meloxicam but does notice good improvement when she takes it. Should get more consistent with meloxicam. Hip abductor strength was weak in the office so we will work on hip abductor conditioning. Return to see me in 6 weeks, injections if not better.

## 2022-03-14 NOTE — Patient Instructions (Signed)
Hip Rehabilitation Protocol:  1.  Side leg raises.  3x30 with no weight, then 3x15 with 2 lb ankle weight, then 3x15 with 5 lb ankle weight 2.  Standing hip rotation.  3x30 with no weight, then 3x15 with 2 lb ankle weight, then 3x15 with 5 lb ankle weight. 3.  Side step ups.  3x30 with no weight, then 3x15 with 5 lbs in backpack, then 3x15 with 10 lbs in backpack. 

## 2022-03-14 NOTE — Progress Notes (Signed)
    Procedures performed today:    None.  Independent interpretation of notes and tests performed by another provider:   None.  Brief History, Exam, Impression, and Recommendations:    Chondromalacia of patellofemoral joint Very pleasant 36 year old female, long history of anterior knee pain, right worse than left, she had positive patellar crepitus and painful patellar compression at the last visit. We added x-rays, meloxicam, home PT. Symptoms are only a touch better, she is inconsistent with meloxicam but does notice good improvement when she takes it. Should get more consistent with meloxicam. Hip abductor strength was weak in the office so we will work on hip abductor conditioning. Return to see me in 6 weeks, injections if not better.    ____________________________________________ Gwen Her. Dianah Field, M.D., ABFM., CAQSM., AME. Primary Care and Sports Medicine [facility]  Adjunct Professor of [place] [employer] [facility] [employer] of Medicine  Risk manager

## 2022-04-25 ENCOUNTER — Ambulatory Visit: Payer: BC Managed Care – PPO | Admitting: Sports Medicine

## 2022-05-18 ENCOUNTER — Ambulatory Visit: Payer: BC Managed Care – PPO | Admitting: Physician Assistant

## 2022-05-21 ENCOUNTER — Ambulatory Visit (INDEPENDENT_AMBULATORY_CARE_PROVIDER_SITE_OTHER): Payer: BC Managed Care – PPO | Admitting: Physician Assistant

## 2022-05-21 VITALS — BP 124/79 | HR 85 | Ht 66.0 in | Wt 195.0 lb

## 2022-05-21 DIAGNOSIS — N644 Mastodynia: Secondary | ICD-10-CM

## 2022-05-21 DIAGNOSIS — Z803 Family history of malignant neoplasm of breast: Secondary | ICD-10-CM | POA: Diagnosis not present

## 2022-05-21 NOTE — Progress Notes (Signed)
Acute Office Visit  Subjective:     Patient ID: Rachael [name], female    DOB: [birth year], 36 y.o.   MRN: [id number]  Chief Complaint  Patient presents with   Follow-up    HPI Patient is in today for bilateral breast pain with right worse than left for one month. Denies any nipple discharge. She does do monthly breast exams and denies any masses, redness, or swelling. No fever, chills, body aches, night sweats. She has never had mammogram. She is not having periods on mirena. She has had the mirena for over a year. Pain is pretty constant with intermittent sharp pains. Her paternal/maternal grandmother had BC. Her mother had ovarian cancer.   She has gained 10lbs in the last 3 months.  .. Active Ambulatory Problems    Diagnosis Date Noted   Eustachian tube dysfunction 06/30/2014   Family history of breast cancer    Genetic testing 02/22/2015   Family hx of ovarian malignancy 04/03/2016   Overweight (BMI 25.0-29.9) 04/03/2016   Supervision of other normal pregnancy, antepartum 09/18/2017   Skin mole 01/12/2018   Suspicious nevus 01/12/2018   Medication side effect 07/10/2019   Acute bilateral low back pain with sciatica 11/02/2019   High triglycerides 03/02/2020   Vitamin D insufficiency 03/02/2020   Neck pain 10/11/2020   Frequent headaches 10/11/2020   Atypical squamous cells cannot exclude high grade squamous intraepithelial lesion on cytologic smear of cervix (ASC-H) 01/02/2021   Dysplasia of cervix, low grade (CIN 1) 01/02/2021   Abnormal weight gain 04/25/2021   Anxiety 06/20/2021   Stress at work 06/20/2021   Body aches 09/25/2021   Sore throat 09/25/2021   Chondromalacia of patellofemoral joint 01/31/2022   Resolved Ambulatory Problems    Diagnosis Date Noted   Allergy-induced asthma 06/30/2009   Allergic rhinitis 10/11/2011   Normal first pregnancy confirmed, currently in third trimester 06/25/2014   SVD (spontaneous vaginal delivery) 06/26/2014   Pain in left  ear 06/30/2014   Pyelectasis of fetus on prenatal ultrasound 12/05/2017   Normal labor 04/26/2018   SVD (spontaneous vaginal delivery) 04/26/2018   Class 1 obesity due to excess calories without serious comorbidity with body mass index (BMI) of 30.0 to 30.9 in adult 10/06/2018   Past Medical History:  Diagnosis Date   Allergy    Asthma      ROS  See HPI.     Objective:    BP 124/79   Pulse 85   Ht '5\' 6"'$  (1.676 m)   Wt 195 lb (88.5 kg)   SpO2 100%   BMI 31.47 kg/m  BP Readings from Last 3 Encounters:  05/21/22 124/79  02/20/22 116/80  09/25/21 127/72   Wt Readings from Last 3 Encounters:  05/21/22 195 lb (88.5 kg)  02/20/22 185 lb (83.9 kg)  09/25/21 182 lb (82.6 kg)      Physical Exam Constitutional:      Appearance: Normal appearance.  HENT:     Head: Normocephalic.  Cardiovascular:     Rate and Rhythm: Normal rate.  Pulmonary:     Effort: Pulmonary effort is normal.  Chest:    Neurological:     General: No focal deficit present.  Psychiatric:        Mood and Affect: Mood normal.          Assessment & Plan:  Marland KitchenMarland KitchenEboney was seen today for follow-up.  Diagnoses and all orders for this visit:  Breast pain in female -  MM DIAG BREAST TOMO BILATERAL; Future -     US BREAST COMPLETE UNI RIGHT INC AXILLA  Family history of breast cancer -     MM DIAG BREAST TOMO BILATERAL; Future -     US BREAST COMPLETE UNI RIGHT INC AXILLA   Mammogram/ultrasound ordered Discussed breast tenderness Limit caffeine and work on weight loss HO given Reassured no red flags   Iran [name], PA-C

## 2022-05-21 NOTE — Patient Instructions (Signed)
Breast Tenderness Breast tenderness is a common problem for women of all ages, but may also occur in men. Breast tenderness has many possible causes, including hormone changes, infections, taking certain medicines, and caffeine intake. In women, the pain usually comes and goes with the menstrual cycle, but it can also be constant. Breast tenderness may range from mild discomfort to severe pain. You may have tests, such as a mammogram or an ultrasound, to check for any unusual findings. Having breast tenderness usually does not mean that you have breast cancer. Follow these instructions at home: Managing pain and discomfort  If directed, put ice on the painful area. To do this: Put ice in a plastic bag. Place a towel between your skin and the bag. Leave the ice on for 20 minutes, 2-3 times a day. If your skin turns bright red, remove the ice right away to prevent skin damage. The risk of skin damage is higher if you cannot feel pain, heat, or cold. Wear a supportive bra or chest support: During exercise. While sleeping, if your breasts are very tender. Medicines Take over-the-counter and prescription medicines only as told by your health care provider. If the cause of your pain is an infection, you may be prescribed an antibiotic medicine. If you were prescribed antibiotics, take them as told by your health care provider. Do not stop using the antibiotic even if you start to feel better. Eating and drinking Decrease the amount of caffeine in your diet. Instead, drink more water and choose caffeine-free drinks. Your health care provider may recommend that you lessen the amount of fat in your diet. You can do this by: Limiting fried foods. Cooking foods using methods such as baking, boiling, grilling, and broiling. General instructions  Keep a log of the days and times when your breasts are most tender. Ask your health care provider how to do breast exams at home. This will help you notice if  you have an unusual growth or lump. Keep all follow-up visits. Contact a health care provider if: Any part of your breast is hard, red, and hot to the touch. This may be a sign of infection. You are a woman and have a new or painful lump in your breast that remains after your menstrual period ends. You are not breastfeeding and you have fluid, especially blood or pus, coming out of your nipples. You have a fever. Your pain does not improve or it gets worse. Your pain is interfering with your daily activities. Summary Breast tenderness may range from mild discomfort to severe pain. Breast tenderness has many possible causes, including hormone changes, infections, taking certain medicines, and caffeine intake. It can be treated with ice, wearing a supportive bra or chest support, and medicines. Make changes to your diet as told by your health care provider. This information is not intended to replace advice given to you by your health care provider. Make sure you discuss any questions you have with your health care provider. Document Revised: 11/01/2021 Document Reviewed: 11/01/2021 Elsevier Patient Education  2023 Elsevier Inc.  

## 2022-05-22 DIAGNOSIS — Z131 Encounter for screening for diabetes mellitus: Secondary | ICD-10-CM | POA: Diagnosis not present

## 2022-05-22 DIAGNOSIS — Z79899 Other long term (current) drug therapy: Secondary | ICD-10-CM | POA: Diagnosis not present

## 2022-05-22 DIAGNOSIS — E781 Pure hyperglyceridemia: Secondary | ICD-10-CM | POA: Diagnosis not present

## 2022-05-22 DIAGNOSIS — E559 Vitamin D deficiency, unspecified: Secondary | ICD-10-CM | POA: Diagnosis not present

## 2022-05-23 LAB — CBC WITH DIFFERENTIAL/PLATELET
Absolute Monocytes: 449 cells/uL (ref 200–950)
Basophils Absolute: 48 cells/uL (ref 0–200)
Basophils Relative: 0.7 %
Eosinophils Absolute: 580 cells/uL — ABNORMAL HIGH (ref 15–500)
Eosinophils Relative: 8.4 %
HCT: 43.1 % (ref 35.0–45.0)
Hemoglobin: 14.3 g/dL (ref 11.7–15.5)
Lymphs Abs: 2353 cells/uL (ref 850–3900)
MCH: 28.9 pg (ref 27.0–33.0)
MCHC: 33.2 g/dL (ref 32.0–36.0)
MCV: 87.1 fL (ref 80.0–100.0)
MPV: 9.7 fL (ref 7.5–12.5)
Monocytes Relative: 6.5 %
Neutro Abs: 3471 cells/uL (ref 1500–7800)
Neutrophils Relative %: 50.3 %
Platelets: 263 10*3/uL (ref 140–400)
RBC: 4.95 10*6/uL (ref 3.80–5.10)
RDW: 12.2 % (ref 11.0–15.0)
Total Lymphocyte: 34.1 %
WBC: 6.9 10*3/uL (ref 3.8–10.8)

## 2022-05-23 LAB — TSH: TSH: 1.86 mIU/L

## 2022-05-23 LAB — COMPLETE METABOLIC PANEL WITH GFR
AG Ratio: 1.7 (calc) (ref 1.0–2.5)
ALT: 71 U/L — ABNORMAL HIGH (ref 6–29)
AST: 26 U/L (ref 10–30)
Albumin: 4.5 g/dL (ref 3.6–5.1)
Alkaline phosphatase (APISO): 61 U/L (ref 31–125)
BUN: 11 mg/dL (ref 7–25)
CO2: 25 mmol/L (ref 20–32)
Calcium: 9.3 mg/dL (ref 8.6–10.2)
Chloride: 105 mmol/L (ref 98–110)
Creat: 0.69 mg/dL (ref 0.50–0.97)
Globulin: 2.7 g/dL (calc) (ref 1.9–3.7)
Glucose, Bld: 84 mg/dL (ref 65–99)
Potassium: 4.1 mmol/L (ref 3.5–5.3)
Sodium: 140 mmol/L (ref 135–146)
Total Bilirubin: 0.6 mg/dL (ref 0.2–1.2)
Total Protein: 7.2 g/dL (ref 6.1–8.1)
eGFR: 116 mL/min/{1.73_m2} (ref >=60)

## 2022-05-23 LAB — LIPID PANEL W/REFLEX DIRECT LDL
Cholesterol: 154 mg/dL (ref <200)
HDL: 57 mg/dL (ref >=50)
LDL Cholesterol (Calc): 72 mg/dL (calc)
Non-HDL Cholesterol (Calc): 97 mg/dL (calc) (ref <130)
Total CHOL/HDL Ratio: 2.7 (calc) (ref <5.0)
Triglycerides: 186 mg/dL — ABNORMAL HIGH (ref <150)

## 2022-05-23 LAB — VITAMIN D 25 HYDROXY (VIT D DEFICIENCY, FRACTURES): Vit D, 25-Hydroxy: 38 ng/mL (ref 30–100)

## 2022-05-23 NOTE — Progress Notes (Signed)
Tayjah,   Vitamin d normal but low normal. Increase to 2000 units a day.  Thyroid looks great.  Liver enzymes went back up some. Could be the weight. When you lost the weight last time this happened it went down. Will continue to watch. Recheck in 3 months.

## 2022-06-07 ENCOUNTER — Other Ambulatory Visit: Payer: BC Managed Care – PPO

## 2022-06-22 ENCOUNTER — Encounter: Payer: Self-pay | Admitting: Physician Assistant

## 2022-06-22 MED ORDER — VALACYCLOVIR HCL 1 G PO TABS: 2000.0000 mg | ORAL_TABLET | Freq: Two times a day (BID) | ORAL | 0 refills | Status: AC

## 2022-09-21 ENCOUNTER — Other Ambulatory Visit: Payer: Self-pay | Admitting: Physician Assistant

## 2022-09-21 DIAGNOSIS — F419 Anxiety disorder, unspecified: Secondary | ICD-10-CM

## 2022-09-21 DIAGNOSIS — Z566 Other physical and mental strain related to work: Secondary | ICD-10-CM

## 2022-11-30 ENCOUNTER — Ambulatory Visit
Admission: EM | Admit: 2022-11-30 | Discharge: 2022-11-30 | Disposition: A | Discharge status: Home / Self Care | Payer: BC Managed Care – PPO | Attending: Family Medicine | Admitting: Family Medicine

## 2022-11-30 DIAGNOSIS — J302 Other seasonal allergic rhinitis: Secondary | ICD-10-CM

## 2022-11-30 DIAGNOSIS — J069 Acute upper respiratory infection, unspecified: Secondary | ICD-10-CM | POA: Diagnosis not present

## 2022-11-30 DIAGNOSIS — H6692 Otitis media, unspecified, left ear: Secondary | ICD-10-CM | POA: Diagnosis not present

## 2022-11-30 MED ORDER — AMOXICILLIN 875 MG PO TABS: 875.0000 mg | ORAL_TABLET | Freq: Two times a day (BID) | ORAL | 0 refills | Status: DC

## 2022-11-30 MED ORDER — PREDNISONE 20 MG PO TABS: ORAL_TABLET | ORAL | 0 refills | Status: DC

## 2022-11-30 NOTE — ED Triage Notes (Signed)
Pt c/o cough, congestion and LT ear pain since Monday. Denies fever. COVID test at home neg yesterday. Taking mucinex and tylenol cold and sinus prn.

## 2022-11-30 NOTE — ED Provider Notes (Signed)
[name]    CSN: [id number] Arrival date & time: 11/30/22  1248      History   Chief Complaint Chief Complaint  Patient presents with   Cough   Nasal Congestion   Otalgia    LT    HPI Rachael [name] is a 37 y.o. female.   Patient complains of four day history of typical cold-like symptoms developing over several days, including mild sore throat, sinus congestion, fatigue, and cough. She also developed left earache at the onset of her symptoms.  She denies fevers, chills, and sweats.  She had a negative home COVID test yesterday.  She has a history of seasonal allergic rhinitis, and a past history of ear tubes.  The history is provided by the patient.    Past Medical History:  Diagnosis Date   Allergy    Asthma    Family history of breast cancer    SVD (spontaneous vaginal delivery) 06/26/2014    Patient Active Problem List   Diagnosis Date Noted   Chondromalacia of patellofemoral joint 01/31/2022   Body aches 09/25/2021   Sore throat 09/25/2021   Anxiety 06/20/2021   Stress at work 06/20/2021   Abnormal weight gain 04/25/2021   Atypical squamous cells cannot exclude high grade squamous intraepithelial lesion on cytologic smear of cervix (ASC-H) 01/02/2021   Dysplasia of cervix, low grade (CIN 1) 01/02/2021   Neck pain 10/11/2020   Frequent headaches 10/11/2020   High triglycerides 03/02/2020   Vitamin D insufficiency 03/02/2020   Acute bilateral low back pain with sciatica 11/02/2019   Medication side effect 07/10/2019   Skin mole 01/12/2018   Suspicious nevus 01/12/2018   Supervision of other normal pregnancy, antepartum 09/18/2017   Family hx of ovarian malignancy 04/03/2016   Overweight (BMI 25.0-29.9) 04/03/2016   Genetic testing 02/22/2015   Family history of breast cancer    Eustachian tube dysfunction 06/30/2014    Past Surgical History:  Procedure Laterality Date   DILATION AND CURETTAGE OF UTERUS N/A 05/20/2019   Procedure:  DILATATION AND CURETTAGE;  Surgeon: Emily Filbert, MD;  Location: [place] [facility];  Service: Gynecology;  Laterality: N/A;   ENDOMETRIAL ABLATION N/A 05/20/2019   Procedure: ATTEMPTED MINERVA  ABLATION;  Surgeon: Emily Filbert, MD;  Location: [place] [facility];  Service: Gynecology;  Laterality: N/A;  minerva rep will be here confirmed on 05/15/19 CS   LAPAROSCOPIC BILATERAL SALPINGECTOMY N/A 05/20/2019   Procedure: LAPAROSCOPIC BILATERAL SALPINGECTOMY;  Surgeon: Emily Filbert, MD;  Location: [place];  Service: Gynecology;  Laterality: N/A;   WISDOM TOOTH EXTRACTION      OB History     Gravida  2   Para  2   Term  2   Preterm      AB      Living  2      SAB      IAB      Ectopic      Multiple  0   Live Births  2            Home Medications    Prior to Admission medications   Medication Sig Start Date End Date Taking? Authorizing Provider  amoxicillin (AMOXIL) 875 MG tablet Take 1 tablet (875 mg total) by mouth 2 (two) times daily. 11/30/22  Yes Kandra Nicolas, MD  predniSONE (DELTASONE) 20 MG tablet Take one tab by mouth twice daily for 4 days, then one daily for 3 days.  Take with food. 11/30/22  Yes Kandra Nicolas, MD  cetirizine (ZYRTEC) 10 MG tablet Take 10 mg by mouth daily.    [provider]  meloxicam (MOBIC) 15 MG tablet One tab PO every 24 hours with a meal for 2 weeks, then once every 24 hours prn pain. Patient taking differently: Take 15 mg by mouth daily as needed. 01/31/22   Silverio Decamp, MD  phentermine (ADIPEX-P) 37.5 MG tablet Take 1 tablet by mouth daily.    [provider]  sertraline (ZOLOFT) 50 MG tablet Take 1 tablet (50 mg total) by mouth daily. Needs appt 09/21/22   Donella Stade, PA-C    Family History Family History  Problem Relation Age of Onset   Breast cancer Maternal Grandmother        Dx 25s; deceased 72s   Diabetes Maternal Grandmother    Cancer Maternal Grandmother     Breast cancer Paternal Grandmother        Dx 52s; deceased 58s   Diabetes Paternal Grandmother    Cancer Paternal Grandmother    Ovarian cancer Mother    Diabetes Other        both parents   Diabetes Maternal Grandfather    Breast cancer Other        pat grandmother's mother   Diabetes Paternal Grandfather     Social History Social History   Tobacco Use   Smoking status: Never   Smokeless tobacco: Never  Vaping Use   Vaping Use: Former  Substance Use Topics   Alcohol use: Yes    Comment: social use   Drug use: No     Allergies   Patient has no known allergies.   Review of Systems Review of Systems + sore throat + cough No pleuritic pain No wheezing + nasal congestion + post-nasal drainage + sinus pain/pressure No itchy/red eyes + left earache No hemoptysis No SOB No fever/chills No nausea No vomiting No abdominal pain No diarrhea No urinary symptoms No skin rash + fatigue No myalgias No headache Used OTC meds (Tylenol sinus) without relief   Physical Exam Triage Vital Signs ED Triage Vitals  Enc Vitals Group     BP 11/30/22 1308 124/82     Pulse Rate 11/30/22 1308 66     Resp 11/30/22 1308 17     Temp 11/30/22 1308 98.2 F (36.8 C)     Temp Source 11/30/22 1308 Oral     SpO2 11/30/22 1308 97 %     Weight --      Height --      Head Circumference --      Peak Flow --      Pain Score 11/30/22 1310 0     Pain Loc --      Pain Edu? --      Excl. in [place]? --    No data found.  Updated Vital Signs BP 124/82 (BP Location: Right Arm)   Pulse 66   Temp 98.2 F (36.8 C) (Oral)   Resp 17   SpO2 97%   Visual Acuity Right Eye Distance:   Left Eye Distance:   Bilateral Distance:    Right Eye Near:   Left Eye Near:    Bilateral Near:     Physical Exam Nursing notes and Vital Signs reviewed. Appearance:  Patient appears stated age, and in no acute distress Eyes:  Pupils are equal, round, and reactive to light and accomodation.   Extraocular movement is intact.  Conjunctivae are not inflamed  Ears:  Canals normal.  Right tympanic membrane normal.  Left tympanic membrane erythematous with effusion present.  Nose:  Congested turbinates.  No sinus tenderness.  Pharynx:  Normal Neck:  Supple.  Mildly enlarged lateral nodes are present, tender to palpation on the left.   Lungs:  Clear to auscultation.  Breath sounds are equal.  Moving air well. Heart:  Regular rate and rhythm without murmurs, rubs, or gallops.  Abdomen:  Nontender without masses or hepatosplenomegaly.  Bowel sounds are present.  No CVA or flank tenderness.  Extremities:  No edema.  Skin:  No rash present.   UC Treatments / Results  Labs (all labs ordered are listed, but only abnormal results are displayed) Labs Reviewed - No data to display  EKG   Radiology No results found.  Procedures Procedures (including critical care time)  Medications Ordered in UC Medications - No data to display  Initial Impression / Assessment and Plan / UC Course  I have reviewed the triage vital signs and the nursing notes.  Pertinent labs & imaging results that were available during my care of the patient were reviewed by me and considered in my medical decision making (see chart for details).    Begin amoxicillin 875mg  BID for one week, and prednisone burst/taper. Followup with Family Doctor if not improved in one week.   Final Clinical Impressions(s) / UC Diagnoses   Final diagnoses:  Acute left otitis media  Viral URI with cough  Seasonal allergic rhinitis, unspecified trigger     Discharge Instructions      Take plain guaifenesin (1200mg  extended release tabs such as Mucinex) twice daily, with plenty of water, for cough and congestion.  May add Pseudoephedrine (30mg , one or two every 4 to 6 hours) for sinus congestion.  Get adequate rest.   May use Afrin nasal spray (or generic oxymetazoline) each morning for about 5 days and then discontinue.   Also recommend using saline nasal spray several times daily and saline nasal irrigation (AYR is a common brand).  Use Flonase nasal spray each morning after using Afrin nasal spray and saline nasal irrigation. May take Delsym Cough Suppressant ("12 Hour Cough Relief") at bedtime for nighttime cough.  Stop all antihistamines (Xyzal, etc) for now, and other non-prescription cough/cold preparations.        ED Prescriptions     Medication Sig Dispense Auth. Provider   amoxicillin (AMOXIL) 875 MG tablet Take 1 tablet (875 mg total) by mouth 2 (two) times daily. 14 tablet Kandra Nicolas, MD   predniSONE (DELTASONE) 20 MG tablet Take one tab by mouth twice daily for 4 days, then one daily for 3 days. Take with food. 11 tablet Kandra Nicolas, MD         Kandra Nicolas, MD 12/02/22 [phone]

## 2022-11-30 NOTE — Discharge Instructions (Addendum)
Take plain guaifenesin (1200mg  extended release tabs such as Mucinex) twice daily, with plenty of water, for cough and congestion.  May add Pseudoephedrine (30mg , one or two every 4 to 6 hours) for sinus congestion.  Get adequate rest.   May use Afrin nasal spray (or generic oxymetazoline) each morning for about 5 days and then discontinue.  Also recommend using saline nasal spray several times daily and saline nasal irrigation (AYR is a common brand).  Use Flonase nasal spray each morning after using Afrin nasal spray and saline nasal irrigation. May take Delsym Cough Suppressant ("12 Hour Cough Relief") at bedtime for nighttime cough.  Stop all antihistamines (Xyzal, etc) for now, and other non-prescription cough/cold preparations.

## 2022-12-11 ENCOUNTER — Other Ambulatory Visit: Payer: Self-pay

## 2023-01-01 ENCOUNTER — Ambulatory Visit: Payer: BC Managed Care – PPO | Admitting: Physician Assistant

## 2023-01-07 ENCOUNTER — Ambulatory Visit: Payer: BC Managed Care – PPO | Admitting: Physician Assistant

## 2023-01-08 ENCOUNTER — Ambulatory Visit: Payer: BC Managed Care – PPO | Admitting: Physician Assistant

## 2023-01-08 VITALS — BP 134/79 | HR 80 | Ht 66.0 in | Wt 202.0 lb

## 2023-01-08 DIAGNOSIS — Z6832 Body mass index (BMI) 32.0-32.9, adult: Secondary | ICD-10-CM

## 2023-01-08 DIAGNOSIS — Z566 Other physical and mental strain related to work: Secondary | ICD-10-CM

## 2023-01-08 DIAGNOSIS — E6609 Other obesity due to excess calories: Secondary | ICD-10-CM

## 2023-01-08 DIAGNOSIS — F419 Anxiety disorder, unspecified: Secondary | ICD-10-CM

## 2023-01-08 DIAGNOSIS — Z79899 Other long term (current) drug therapy: Secondary | ICD-10-CM

## 2023-01-08 MED ORDER — SERTRALINE HCL 50 MG PO TABS: 50.0000 mg | ORAL_TABLET | Freq: Every day | ORAL | 4 refills | Status: DC

## 2023-01-08 MED ORDER — SAXENDA 18 MG/3ML ~~LOC~~ SOPN: 3.0000 mg | PEN_INJECTOR | Freq: Every day | SUBCUTANEOUS | 1 refills | Status: DC

## 2023-01-08 MED ORDER — METFORMIN HCL 500 MG PO TABS: 500.0000 mg | ORAL_TABLET | Freq: Two times a day (BID) | ORAL | 1 refills | Status: DC

## 2023-01-08 NOTE — Progress Notes (Signed)
Established Patient Office Visit  Subjective   Patient ID: Rachael [name], female    DOB: [birth year]  Age: 38 y.o. MRN: [id number]  Chief Complaint  Patient presents with   Follow-up    HPI Pt is a 37 yo obese female with anxiety and stress who presents to the clinic for medication follow up.   Pt is doing well with mood on zoloft. No concerns.   Pt is trying to lose weight. Phentermine, topamax and wellbutrin have not worked. She is exercising at least 3 times a week and eating more fruits and vegetables. Last insurance would not pay for saxenda. She has switched insurance companies.   .. Active Ambulatory Problems    Diagnosis Date Noted   Eustachian tube dysfunction 06/30/2014   Family history of breast cancer    Genetic testing 02/22/2015   Family hx of ovarian malignancy 04/03/2016   Overweight (BMI 25.0-29.9) 04/03/2016   Supervision of other normal pregnancy, antepartum 09/18/2017   Skin mole 01/12/2018   Suspicious nevus 01/12/2018   Class 1 obesity due to excess calories without serious comorbidity with body mass index (BMI) of 32.0 to 32.9 in adult 10/06/2018   Medication side effect 07/10/2019   Acute bilateral low back pain with sciatica 11/02/2019   High triglycerides 03/02/2020   Vitamin D insufficiency 03/02/2020   Neck pain 10/11/2020   Frequent headaches 10/11/2020   Atypical squamous cells cannot exclude high grade squamous intraepithelial lesion on cytologic smear of cervix (ASC-H) 01/02/2021   Dysplasia of cervix, low grade (CIN 1) 01/02/2021   Abnormal weight gain 04/25/2021   Anxiety 06/20/2021   Stress at work 06/20/2021   Body aches 09/25/2021   Sore throat 09/25/2021   Chondromalacia of patellofemoral joint 01/31/2022   Resolved Ambulatory Problems    Diagnosis Date Noted   Allergy-induced asthma 06/30/2009   Allergic rhinitis 10/11/2011   Normal first pregnancy confirmed, currently in third trimester 06/25/2014   SVD (spontaneous vaginal  delivery) 06/26/2014   Pain in left ear 06/30/2014   Pyelectasis of fetus on prenatal ultrasound 12/05/2017   Normal labor 04/26/2018   SVD (spontaneous vaginal delivery) 04/26/2018   Past Medical History:  Diagnosis Date   Allergy    Asthma      ROS See HPI.    Objective:     BP 134/79   Pulse 80   Ht 5\' 6"  (1.676 m)   Wt 202 lb (91.6 kg)   SpO2 99%   BMI 32.60 kg/m  BP Readings from Last 3 Encounters:  01/08/23 134/79  11/30/22 124/82  05/21/22 124/79   Wt Readings from Last 3 Encounters:  01/08/23 202 lb (91.6 kg)  05/21/22 195 lb (88.5 kg)  02/20/22 185 lb (83.9 kg)    ..    01/08/2023   10:10 AM 05/21/2022    9:21 AM 08/11/2021   10:33 AM 06/20/2021    8:59 AM 07/04/2020    8:14 AM  Depression screen PHQ 2/9  Decreased Interest 0 0 0 0 0  Down, Depressed, Hopeless 0 0 0 1 0  PHQ - 2 Score 0 0 0 1 0  Altered sleeping 1  0 1   Tired, decreased energy 1  0 0   Change in appetite 1  0 0   Feeling bad or failure about yourself  0  0 0   Trouble concentrating 0  0 0   Moving slowly or fidgety/restless 0  0 0   Suicidal thoughts 0  0 0   PHQ-9 Score 3  0 2   Difficult doing work/chores Not difficult at all  Not difficult at all Somewhat difficult    ..    01/08/2023   10:12 AM 08/11/2021   10:34 AM 06/20/2021    9:01 AM 03/02/2020    9:47 AM  GAD 7 : Generalized Anxiety Score  Nervous, Anxious, on Edge 0 0 1 0  Control/stop worrying 0 0 1 0  Worry too much - different things 0 0 1 0  Trouble relaxing 0 0 1 0  Restless 0 0 1 0  Easily annoyed or irritable 1 0 1 0  Afraid - awful might happen 0 0 0 0  Total GAD 7 Score 1 0 6 0  Anxiety Difficulty Not difficult at all Not difficult at all Somewhat difficult Not difficult at all      Physical Exam Constitutional:      Appearance: Normal appearance. She is obese.  Cardiovascular:     Rate and Rhythm: Normal rate and regular rhythm.  Pulmonary:     Effort: Pulmonary effort is normal.     Breath  sounds: Normal breath sounds.  Neurological:     General: No focal deficit present.     Mental Status: She is alert and oriented to person, place, and time.  Psychiatric:        Mood and Affect: Mood normal.        Assessment & Plan:  Marland KitchenMarland KitchenMaimouna was seen today for follow-up.  Diagnoses and all orders for this visit:  Class 1 obesity due to excess calories without serious comorbidity with body mass index (BMI) of 32.0 to 32.9 in adult -     Liraglutide -Weight Management (SAXENDA) 18 MG/3ML SOPN; Inject 3 mg into the skin daily. 0.6 mg inj subcut daily for 1 week, then incr by 0.6 mg weekly until reaching 3 mg injected subcut daily -     metFORMIN (GLUCOPHAGE) 500 MG tablet; Take 1 tablet (500 mg total) by mouth 2 (two) times daily with a meal.  Anxiety -     sertraline (ZOLOFT) 50 MG tablet; Take 1 tablet (50 mg total) by mouth daily.  Stress at work -     sertraline (ZOLOFT) 50 MG tablet; Take 1 tablet (50 mg total) by mouth daily.  Medication management -     COMPLETE METABOLIC PANEL WITH GFR   PHQ/GAD to goal Refill zoloft  Cmp ordered Vitals look great .Marland KitchenDiscussed low carb diet with 1500 calories and 80g of protein.  Exercising at least 150 minutes a week.  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  Will try saxenda again to see if covered Titrate up  If start saxenda then follow up in 3 months    Tandy Gaw, PA-C

## 2023-04-12 ENCOUNTER — Encounter: Payer: Self-pay | Admitting: Family Medicine

## 2023-04-12 ENCOUNTER — Ambulatory Visit: Payer: BC Managed Care – PPO | Admitting: Family Medicine

## 2023-04-12 VITALS — BP 115/75 | HR 80 | Temp 99.3°F | Ht 66.0 in | Wt 210.0 lb

## 2023-04-12 DIAGNOSIS — J029 Acute pharyngitis, unspecified: Secondary | ICD-10-CM

## 2023-04-12 DIAGNOSIS — B950 Streptococcus, group A, as the cause of diseases classified elsewhere: Secondary | ICD-10-CM | POA: Insufficient documentation

## 2023-04-12 LAB — POCT RAPID STREP A (OFFICE): Rapid Strep A Screen: POSITIVE — AB

## 2023-04-12 MED ORDER — AMOXICILLIN 500 MG PO CAPS: 500.0000 mg | ORAL_CAPSULE | Freq: Two times a day (BID) | ORAL | 0 refills | Status: AC

## 2023-04-12 NOTE — Progress Notes (Signed)
   Acute Office Visit  Subjective:     Patient ID: Rachael [name], female    DOB: [birth year], 37 y.o.   MRN: [id number]  Chief Complaint  Patient presents with   Sore Throat    HPI Patient is in today for sore throat. Son currently has strep.  Review of Systems  Constitutional:  Negative for chills and fever.  HENT:  Positive for sore throat.   Respiratory:  Negative for cough and shortness of breath.   Cardiovascular:  Negative for chest pain.  Neurological:  Negative for headaches.        Objective:    BP 115/75   Pulse 80   Temp 99.3 F (37.4 C) (Oral)   Ht 5\' 6"  (1.676 m)   Wt 210 lb (95.3 kg)   SpO2 98%   BMI 33.89 kg/m    Physical Exam Vitals and nursing note reviewed.  Constitutional:      General: She is not in acute distress.    Appearance: Normal appearance.  HENT:     Head: Normocephalic and atraumatic.     Right Ear: External ear normal.     Left Ear: External ear normal.     Nose: Nose normal.     Mouth/Throat:     Pharynx: Posterior oropharyngeal erythema present.  Eyes:     Conjunctiva/sclera: Conjunctivae normal.  Cardiovascular:     Rate and Rhythm: Normal rate and regular rhythm.  Pulmonary:     Effort: Pulmonary effort is normal.     Breath sounds: Normal breath sounds.  Neurological:     General: No focal deficit present.     Mental Status: She is alert and oriented to person, place, and time.  Psychiatric:        Mood and Affect: Mood normal.        Behavior: Behavior normal.        Thought Content: Thought content normal.        Judgment: Judgment normal.     Results for orders placed or performed in visit on 04/12/23  POCT rapid strep A  Result Value Ref Range   Rapid Strep A Screen Positive (A) Negative        Assessment & Plan:   Problem List Items Addressed This Visit       Other   Sore throat   Relevant Orders   POCT rapid strep A (Completed)   Group A streptococcal infection - Primary    Positive rapid  strep test. Amoxicillin sent in for pt       Meds ordered this encounter  Medications   amoxicillin (AMOXIL) 500 MG capsule    Sig: Take 1 capsule (500 mg total) by mouth 2 (two) times daily for 10 days.    Dispense:  20 capsule    Refill:  0    No follow-ups on file.  Charlton Amor, DO

## 2023-04-12 NOTE — Assessment & Plan Note (Signed)
Positive rapid strep test. Amoxicillin sent in for pt

## 2023-05-17 ENCOUNTER — Other Ambulatory Visit: Payer: Self-pay | Admitting: Physician Assistant

## 2023-05-17 DIAGNOSIS — N644 Mastodynia: Secondary | ICD-10-CM

## 2023-05-17 DIAGNOSIS — Z803 Family history of malignant neoplasm of breast: Secondary | ICD-10-CM

## 2023-07-29 ENCOUNTER — Ambulatory Visit: Payer: BC Managed Care – PPO | Admitting: Physician Assistant

## 2023-07-29 VITALS — BP 125/77 | HR 77 | Temp 98.0°F | Ht 66.0 in | Wt 207.0 lb

## 2023-07-29 DIAGNOSIS — J209 Acute bronchitis, unspecified: Secondary | ICD-10-CM

## 2023-07-29 MED ORDER — METHYLPREDNISOLONE 4 MG PO TBPK: ORAL_TABLET | ORAL | 0 refills | Status: DC

## 2023-07-29 MED ORDER — AZITHROMYCIN 250 MG PO TABS: ORAL_TABLET | ORAL | 0 refills | Status: DC

## 2023-07-29 NOTE — Progress Notes (Unsigned)
   Acute Office Visit  Subjective:     Patient ID: Rachael [name], female    DOB: Mar 23, 1986, 37 y.o.   MRN: [id number]  Chief Complaint  Patient presents with   Cough    Cough, congestion, and sorethroat last thrus    Cough Associated symptoms include a sore throat. Pertinent negatives include no chills, fever or shortness of breath.   Patient is in today for wet cough and sore throat starting Thursday, 07/26/23. She also reports congestion. Denies fever. She has tried Sudafed and Nyquil which help a little. No one in her family is feeling similarly.  Review of Systems  Constitutional:  Negative for chills and fever.  HENT:  Positive for congestion and sore throat.   Respiratory:  Positive for cough. Negative for shortness of breath.         Objective:    BP 125/77 (BP Location: Left Arm, Patient Position: Sitting, Cuff Size: Normal)   Pulse 77   Temp 98 F (36.7 C)   Ht 5\' 6"  (1.676 m)   Wt 93.9 kg   SpO2 99%   BMI 33.41 kg/m    Physical Exam HENT:     Right Ear: Tympanic membrane, ear canal and external ear normal.     Left Ear: Tympanic membrane, ear canal and external ear normal.     Nose: Nose normal.     Mouth/Throat:     Mouth: Mucous membranes are moist.     Pharynx: Posterior oropharyngeal erythema present. No oropharyngeal exudate.  Cardiovascular:     Rate and Rhythm: Normal rate and regular rhythm.     Pulses: Normal pulses.     Heart sounds: Normal heart sounds.  Pulmonary:     Effort: Pulmonary effort is normal.     Breath sounds: Normal breath sounds.  Lymphadenopathy:     Cervical: No cervical adenopathy.       Assessment & Plan:   Problem List Items Addressed This Visit   None Visit Diagnoses     Acute bronchitis, unspecified organism    -  Primary   Relevant Medications   methylPREDNISolone (MEDROL DOSEPAK) 4 MG TBPK tablet   azithromycin (ZITHROMAX Z-PAK) 250 MG tablet       Meds ordered this encounter  Medications    methylPREDNISolone (MEDROL DOSEPAK) 4 MG TBPK tablet    Sig: Take as directed by package insert.    Dispense:  21 tablet    Refill:  0   azithromycin (ZITHROMAX Z-PAK) 250 MG tablet    Sig: Take 2 tablets (500 mg) on  Day 1,  followed by 1 tablet (250 mg) once daily on Days 2 through 5.    Dispense:  6 tablet    Refill:  0   Most likely viral etiology. Prescribed Medrol Dosepak for airway inflammation. Continue Nyquil at night. Prescribed Z-Pak for patient to use if she is not better by Thursday or if her symptoms get worse.  No follow-ups on file.  AnnaCollin M Mace, Student-PA

## 2023-07-29 NOTE — Patient Instructions (Addendum)
Start medrol dose pack and zpak only if needed  Acute Bronchitis, Adult  Acute bronchitis is when air tubes in the lungs (bronchi) suddenly get swollen. The condition can make it hard for you to breathe. In adults, acute bronchitis usually goes away within 2 weeks. A cough caused by bronchitis may last up to 3 weeks. Smoking, allergies, and asthma can make the condition worse. What are the causes? Germs that cause cold and flu (viruses). The most common cause of this condition is the virus that causes the common cold. Bacteria. Substances that bother (irritate) the lungs, including: Smoke from cigarettes and other types of tobacco. Dust and pollen. Fumes from chemicals, gases, or burned fuel. Indoor or outdoor air pollution. What increases the risk? A weak body's defense system. This is also called the immune system. Any condition that affects your lungs and breathing, such as asthma. What are the signs or symptoms? A cough. Coughing up clear, yellow, or green mucus. Making high-pitched whistling sounds when you breathe, most often when you breathe out (wheezing). Runny or stuffy nose. Having too much mucus in your lungs (chest congestion). Shortness of breath. Body aches. A sore throat. How is this treated? Acute bronchitis may go away over time without treatment. Your doctor may tell you to: Drink more fluids. This will help thin your mucus so it is easier to cough up. Use a device that gets medicine into your lungs (inhaler). Use a vaporizer or a humidifier. These are machines that add water to the air. This helps with coughing and poor breathing. Take a medicine that thins mucus and helps clear it from your lungs. Take a medicine that prevents or stops coughing. It is not common to take an antibiotic medicine for this condition. Follow these instructions at home:  Take over-the-counter and prescription medicines only as told by your doctor. Use an inhaler, vaporizer, or  humidifier as told by your doctor. Take two teaspoons (10 mL) of honey at bedtime. This helps lessen your coughing at night. Drink enough fluid to keep your pee (urine) pale yellow. Do not smoke or use any products that contain nicotine or tobacco. If you need help quitting, ask your doctor. Get a lot of rest. Return to your normal activities when your doctor says that it is safe. Keep all follow-up visits. How is this prevented?  Wash your hands often with soap and water for at least 20 seconds. If you cannot use soap and water, use hand sanitizer. Avoid contact with people who have cold symptoms. Try not to touch your mouth, nose, or eyes with your hands. Avoid breathing in smoke or chemical fumes. Make sure to get the flu shot every year. Contact a doctor if: Your symptoms do not get better in 2 weeks. You have trouble coughing up the mucus. Your cough keeps you awake at night. You have a fever. Get help right away if: You cough up blood. You have chest pain. You have very bad shortness of breath. You faint or keep feeling like you are going to faint. You have a very bad headache. Your fever or chills get worse. These symptoms may be an emergency. Get help right away. Call your local emergency services (911 in the [place] Do not wait to see if the symptoms will go away. Do not drive yourself to the hospital. Summary Acute bronchitis is when air tubes in the lungs (bronchi) suddenly get swollen. In adults, acute bronchitis usually goes away within 2 weeks. Drink more fluids.  This will help thin your mucus so it is easier to cough up. Take over-the-counter and prescription medicines only as told by your doctor. Contact a doctor if your symptoms do not improve after 2 weeks of treatment. This information is not intended to replace advice given to you by your health care provider. Make sure you discuss any questions you have with your health care provider. Document Revised:  12/21/2020 Document Reviewed: 12/21/2020 Elsevier Patient Education  2024 [employer].

## 2023-07-30 ENCOUNTER — Encounter: Payer: Self-pay | Admitting: Physician Assistant

## 2023-09-25 ENCOUNTER — Ambulatory Visit (INDEPENDENT_AMBULATORY_CARE_PROVIDER_SITE_OTHER): Payer: Commercial Managed Care - PPO | Admitting: Physician Assistant

## 2023-09-25 ENCOUNTER — Encounter: Payer: Self-pay | Admitting: Physician Assistant

## 2023-09-25 VITALS — BP 107/77 | HR 66 | Ht 66.0 in | Wt 207.0 lb

## 2023-09-25 DIAGNOSIS — Z6833 Body mass index (BMI) 33.0-33.9, adult: Secondary | ICD-10-CM | POA: Diagnosis not present

## 2023-09-25 DIAGNOSIS — F419 Anxiety disorder, unspecified: Secondary | ICD-10-CM | POA: Diagnosis not present

## 2023-09-25 DIAGNOSIS — E66811 Obesity, class 1: Secondary | ICD-10-CM | POA: Diagnosis not present

## 2023-09-25 DIAGNOSIS — E6609 Other obesity due to excess calories: Secondary | ICD-10-CM

## 2023-09-25 MED ORDER — NALTREXONE-BUPROPION HCL ER 8-90 MG PO TB12: 2.0000 | ORAL_TABLET | Freq: Two times a day (BID) | ORAL | 2 refills | Status: DC

## 2023-09-25 MED ORDER — NALTREXONE-BUPROPION HCL ER 8-90 MG PO TB12: ORAL_TABLET | ORAL | 0 refills | Status: DC

## 2023-09-25 NOTE — Progress Notes (Signed)
Established Patient Office Visit  Subjective   Patient ID: Rachael [name], female    DOB: [birth year]  Age: 38 y.o. MRN: [id number]  No chief complaint on file.   HPI  Pt is a 38 yo female coming to the clinic today for a medication follow up and to discuss weight management.  Pt is currently on 50 mg of Sertraline and states that she is doing great on this dose. She has no issues or concerns at this time.  The pt is also looking to discuss weight loss management today as well. In the past she has tried phentermine, topamax, wellbutrin and metformin and none of them have worked. She states that for exercise she walks while her children are at soccer practice. She does intermittent fasting and tries to eat well-balanced meals but sometimes struggles.   Review of Systems  All other systems reviewed and are negative.  Active Ambulatory Problems    Diagnosis Date Noted   Eustachian tube dysfunction 06/30/2014   Family history of breast cancer    Genetic testing 02/22/2015   Family hx of ovarian malignancy 04/03/2016   Skin mole 01/12/2018   Suspicious nevus 01/12/2018   Class 1 obesity due to excess calories without serious comorbidity with body mass index (BMI) of 33.0 to 33.9 in adult 10/06/2018   Medication side effect 07/10/2019   Acute bilateral low back pain with sciatica 11/02/2019   High triglycerides 03/02/2020   Vitamin D insufficiency 03/02/2020   Neck pain 10/11/2020   Frequent headaches 10/11/2020   Atypical squamous cells cannot exclude high grade squamous intraepithelial lesion on cytologic smear of cervix (ASC-H) 01/02/2021   Dysplasia of cervix, low grade (CIN 1) 01/02/2021   Abnormal weight gain 04/25/2021   Anxiety 06/20/2021   Stress at work 06/20/2021   Body aches 09/25/2021   Sore throat 09/25/2021   Chondromalacia of patellofemoral joint 01/31/2022   Resolved Ambulatory Problems    Diagnosis Date Noted   Allergy-induced asthma 06/30/2009   Allergic  rhinitis 10/11/2011   Normal first pregnancy confirmed, currently in third trimester 06/25/2014   SVD (spontaneous vaginal delivery) 06/26/2014   Pain in left ear 06/30/2014   Overweight (BMI 25.0-29.9) 04/03/2016   Supervision of other normal pregnancy, antepartum 09/18/2017   Pyelectasis of fetus on prenatal ultrasound 12/05/2017   Normal labor 04/26/2018   SVD (spontaneous vaginal delivery) 04/26/2018   Group A streptococcal infection 04/12/2023   Past Medical History:  Diagnosis Date   Allergy    Asthma        Objective  BP 107/77   Pulse 66   Ht 5\' 6"  (1.676 m)   Wt 207 lb (93.9 kg)   SpO2 99%   BMI 33.41 kg/m  BP Readings from Last 3 Encounters:  09/25/23 107/77  07/29/23 125/77  04/12/23 115/75   Wt Readings from Last 3 Encounters:  09/25/23 207 lb (93.9 kg)  07/29/23 207 lb (93.9 kg)  04/12/23 210 lb (95.3 kg)     Physical Exam Constitutional:      Appearance: Normal appearance. She is obese.  HENT:     Head: Normocephalic.  Cardiovascular:     Rate and Rhythm: Normal rate and regular rhythm.     Pulses: Normal pulses.     Heart sounds: Normal heart sounds.  Pulmonary:     Effort: Pulmonary effort is normal.     Breath sounds: Normal breath sounds.  Musculoskeletal:     Cervical back: Neck supple.  Right lower leg: No edema.     Left lower leg: No edema.  Skin:    General: Skin is warm and dry.  Neurological:     General: No focal deficit present.     Mental Status: She is alert and oriented to person, place, and time.  Psychiatric:        Mood and Affect: Mood normal.        Behavior: Behavior normal.      Assessment & Plan:   Diagnoses and all orders for this visit:  Class 1 obesity due to excess calories without serious comorbidity with body mass index (BMI) of 33.0 to 33.9 in adult -     Naltrexone-buPROPion HCl ER 8-90 MG TB12; 1 tab daily for week 1, then 1 tab BID for week 2, then 2 tab PO qAM and 1 tab PO qPM for week 3, then 2  tabs BID. -     Naltrexone-buPROPion HCl ER 8-90 MG TB12; Take 2 tablets by mouth 2 (two) times daily.  Anxiety   Continue on 50 mg of Sertraline  Contrave prescription sent; taper dose to start Continue with IF Try to eat well-balanced meals with at least 80 g of protein daily  Aim to get about 150 minutes of moderate intensity exercise a week Avoid high impact exercises that will further bother your knee; try low impact exercises such as Pilates, water aerobics, yoga or cycling  Magnesium Glycinate 250-500 mg daily to help with sleep, anxiety, stress, muscle regeneration  Follow up in about 3 months (around 12/24/2023)  Tandy Gaw, PA-C

## 2023-09-25 NOTE — Patient Instructions (Addendum)
Taper up follow directions on bottle  Naltrexone; Bupropion Extended-Release Tablets What is this medication? NALTREXONE; BUPROPION (nal TREX one; byoo PROE pee on) promotes weight loss. It may also be used to maintain weight loss. It works by decreasing appetite. Changes to diet and exercise are often combined with this medication. This medicine may be used for other purposes; ask your health care provider or pharmacist if you have questions. COMMON BRAND NAME(S): Contrave What should I tell my care team before I take this medication? They need to know if you have any of these conditions: An eating disorder, such as anorexia or bulimia Diabetes Depression Frequently drink alcohol Glaucoma Head injury Heart disease High blood pressure History of a tumor or infection of your brain or spine History of heart attack or stroke History of irregular heartbeat History of substance use disorder or alcohol use disorder Kidney disease Liver disease Low levels of sodium in the blood Mental health condition Seizures Suicidal thoughts, plans, or attempt by you or a family member Taken an MAOI like Carbex, Eldepryl, Marplan, Nardil, or Parnate in last 14 days An unusual or allergic reaction to bupropion, naltrexone, other medications, foods, dyes, or preservatives Breast-feeding Pregnant or trying to become pregnant How should I use this medication? Take this medication by mouth with a full glass of water. Take it as directed on the prescription label at the same time every day. Do not cut, crush, or chew this medication. Swallow the tablets whole. You can take it with or without food. Do not take with high-fat meals as this may increase your risk of seizures. Keep taking it unless your care team tells you to stop. A special MedGuide will be given to you by the pharmacist with each prescription and refill. Be sure to read this information carefully each time. Talk to your care team about the use  of this medication in children. Special care may be needed. Overdosage: If you think you have taken too much of this medicine contact a poison control center or emergency room at once. NOTE: This medicine is only for you. Do not share this medicine with others. What if I miss a dose? If you miss a dose, skip it. Take your next dose at the normal time. Do not take extra or 2 doses at the same time to make up for the missed dose. What may interact with this medication? Do not take this medication with any of the following: Any medications used to stop taking opioids, such as methadone or buprenorphine Linezolid MAOIs, such as Carbex, Eldepryl, Marplan, Nardil, and Parnate Methylene blue (injected into a vein) Opioid medications Other medications that contain bupropion, such as Zyban or Wellbutrin This medication may also interact with the following: Alcohol Certain medications for blood pressure, such as metoprolol, propranolol Certain medications for depression, anxiety, or mental health conditions Certain medications for HIV or hepatitis Certain medications for irregular heart beat, such as propafenone, flecainide Certain medications for Parkinson disease, such as amantadine, levodopa Certain medications for seizures, such as carbamazepine, phenytoin, phenobarbital Certain medications for sleep Cimetidine Clopidogrel Cyclophosphamide Digoxin Disulfiram Furazolidone Isoniazid Nicotine Orphenadrine Procarbazine Steroid medications, such as prednisone or cortisone Stimulant medications for attention disorders, weight loss, or to stay awake Tamoxifen Theophylline Thiotepa Ticlopidine Tramadol Warfarin This list may not describe all possible interactions. Give your health care provider a list of all the medicines, herbs, non-prescription drugs, or dietary supplements you use. Also tell them if you smoke, drink alcohol, or use illegal drugs. Some  items may interact with your  medicine. What should I watch for while using this medication? Visit your care team for regular checks on your progress. This medication may cause serious skin reactions. They can happen weeks to months after starting the medication. Contact your care team right away if you notice fevers or flu-like symptoms with a rash. The rash may be red or purple and then turn into blisters or peeling of the skin. Or, you might notice a red rash with swelling of the face, lips, or lymph nodes in your neck or under your arms. This medication may affect blood sugar. Ask your care team if changes in diet or medications are needed if you have diabetes. Patients and their families should watch out for new or worsening depression or thoughts of suicide. This includes sudden changes in mood, behaviors, or thoughts. These changes can happen at any time but are more common in the beginning of treatment or after a change in dose. Call your care team right away if you experience these thoughts or worsening depression. Avoid alcoholic drinks while taking this medication. Drinking large amounts of alcoholic beverages, using sleeping or anxiety medications, or quickly stopping the use of these agents while taking this medication may increase your risk for a seizure. Do not drive or use heavy machinery until you know how this medication affects you. This medication can impair your ability to perform these tasks. Inform your care team if you wish to become pregnant or think you might be pregnant. Losing weight while pregnant is not advised and may cause harm to the unborn child. Talk to your care team for more information. What side effects may I notice from receiving this medication? Side effects that you should report to your care team as soon as possible: Allergic reactions--skin rash, itching, hives, swelling of the face, lips, tongue, or throat Fast or irregular heartbeat Increase in blood pressure Liver injury--right upper  belly pain, loss of appetite, nausea, light-colored stool, dark yellow or brown urine, yellowing skin or eyes, unusual weakness or fatigue Mood and behavior changes--anxiety, nervousness, confusion, hallucinations, irritability, hostility, thoughts of suicide or self-harm, worsening mood, feelings of depression Redness, blistering, peeling, or loosening of the skin, including inside the mouth Seizures Sudden eye pain or change in vision such as blurry vision, seeing halos around lights, vision loss Side effects that usually do not require medical attention (report to your care team if they continue or are bothersome): Constipation Dizziness Dry mouth Fatigue Headache Nausea Trouble sleeping Vomiting This list may not describe all possible side effects. Call your doctor for medical advice about side effects. You may report side effects to FDA at 1-800-FDA-1088. Where should I keep my medication? Keep out of the reach of children and pets. Store between 15 and 30 degrees C (59 and 86 degrees F). Get rid of any unused medication after the expiration date. To get rid of medications that are no longer needed or have expired: Take the medication to a medication take-back program. Check with your pharmacy or law enforcement to find a location. If you cannot return the medication, check the label or package insert to see if the medication should be thrown out in the garbage or flushed down the toilet. If you are not sure, ask your care team. If it is safe to put it in the trash, pour the medication out of the container. Mix the medication with cat litter, dirt, coffee grounds, or other unwanted substance. Seal the mixture in  a bag or container. Put it in the trash. NOTE: This sheet is a summary. It may not cover all possible information. If you have questions about this medicine, talk to your doctor, pharmacist, or health care provider.  2024 Elsevier/Gold Standard (2021-10-16 00:00:00)

## 2023-10-01 ENCOUNTER — Encounter: Payer: Self-pay | Admitting: Physician Assistant

## 2023-10-01 MED ORDER — VALACYCLOVIR HCL 1 G PO TABS: 2000.0000 mg | ORAL_TABLET | Freq: Two times a day (BID) | ORAL | 0 refills | Status: AC

## 2023-10-02 NOTE — Telephone Encounter (Signed)
Contacted pharmacy and was told that the contrave prescription was delivered this morning.  Called patient and informed that she should check her mail that should be in her mailbox.

## 2023-12-24 ENCOUNTER — Ambulatory Visit (INDEPENDENT_AMBULATORY_CARE_PROVIDER_SITE_OTHER): Payer: BC Managed Care – PPO | Admitting: Physician Assistant

## 2023-12-24 ENCOUNTER — Encounter: Payer: Self-pay | Admitting: Physician Assistant

## 2023-12-24 VITALS — BP 118/79 | HR 70 | Ht 66.0 in | Wt 197.0 lb

## 2023-12-24 DIAGNOSIS — E6609 Other obesity due to excess calories: Secondary | ICD-10-CM

## 2023-12-24 DIAGNOSIS — J302 Other seasonal allergic rhinitis: Secondary | ICD-10-CM | POA: Insufficient documentation

## 2023-12-24 DIAGNOSIS — J029 Acute pharyngitis, unspecified: Secondary | ICD-10-CM | POA: Diagnosis not present

## 2023-12-24 DIAGNOSIS — J301 Allergic rhinitis due to pollen: Secondary | ICD-10-CM | POA: Insufficient documentation

## 2023-12-24 DIAGNOSIS — E66811 Obesity, class 1: Secondary | ICD-10-CM | POA: Diagnosis not present

## 2023-12-24 DIAGNOSIS — Z6831 Body mass index (BMI) 31.0-31.9, adult: Secondary | ICD-10-CM

## 2023-12-24 MED ORDER — NALTREXONE-BUPROPION HCL ER 8-90 MG PO TB12: 2.0000 | ORAL_TABLET | Freq: Two times a day (BID) | ORAL | 11 refills | Status: DC

## 2023-12-24 MED ORDER — METHYLPREDNISOLONE SODIUM SUCC 125 MG IJ SOLR
125.0000 mg | Freq: Once | INTRAMUSCULAR | Status: AC
  Administered 2023-12-24: 125 mg via INTRAMUSCULAR

## 2023-12-24 NOTE — Patient Instructions (Signed)

## 2023-12-25 ENCOUNTER — Encounter: Payer: Self-pay | Admitting: Physician Assistant

## 2023-12-25 NOTE — Progress Notes (Signed)
   Established Patient Office Visit  Subjective   Patient ID: Rachael [name], female    DOB: [birth year]  Age: 38 y.o. MRN: [id number]  Chief Complaint  Patient presents with   Medical Management of Chronic Issues    Wgt management     HPI Pt is a 38 yo obese female who presents to the clinic to follow up on weight. She has struggled over many years. She has tried phentermine /topamax , diets, IF and nothing has really helped. She did start contrave  and has lost 10lbs over 3 months. She is walking for exercise. She denies any side effects.   She does have a mild scratchy throat. No other URI symptoms. Has ongoing allergies and was out in the yard this weekend.    Review of Systems  All other systems reviewed and are negative.     Objective:     BP 118/79   Pulse 70   Ht 5\' 6"  (1.676 m)   Wt 197 lb (89.4 kg)   SpO2 100%   BMI 31.80 kg/m  BP Readings from Last 3 Encounters:  12/24/23 118/79  09/25/23 107/77  07/29/23 125/77   Wt Readings from Last 3 Encounters:  12/24/23 197 lb (89.4 kg)  09/25/23 207 lb (93.9 kg)  07/29/23 207 lb (93.9 kg)      Physical Exam Constitutional:      Appearance: Normal appearance.  HENT:     Head: Normocephalic.     Right Ear: Tympanic membrane normal.     Left Ear: Tympanic membrane normal.     Nose: Nose normal.     Mouth/Throat:     Mouth: Mucous membranes are moist.     Pharynx: Posterior oropharyngeal erythema present. No oropharyngeal exudate.  Eyes:     Conjunctiva/sclera: Conjunctivae normal.  Cardiovascular:     Rate and Rhythm: Normal rate and regular rhythm.     Pulses: Normal pulses.     Heart sounds: Normal heart sounds.  Pulmonary:     Effort: Pulmonary effort is normal.     Breath sounds: Normal breath sounds.  Neurological:     Mental Status: She is alert.  Psychiatric:        Mood and Affect: Mood normal.       Assessment & Plan:  Rachael [name] was seen today for medical management of chronic  issues.  Diagnoses and all orders for this visit:  Class 1 obesity due to excess calories without serious comorbidity with body mass index (BMI) of 31.0 to 31.9 in adult -     Naltrexone -buPROPion  HCl ER 8-90 MG TB12; Take 2 tablets by mouth 2 (two) times daily.  Seasonal allergies -     methylPREDNISolone  sodium succinate (SOLU-MEDROL ) 125 mg/2 mL injection 125 mg  Seasonal allergic rhinitis due to pollen -     methylPREDNISolone  sodium succinate (SOLU-MEDROL ) 125 mg/2 mL injection 125 mg  Acute pharyngitis, unspecified etiology -     methylPREDNISolone  sodium succinate (SOLU-MEDROL ) 125 mg/2 mL injection 125 mg   Pt is tolerating contrave  and losing weight Continue with diet and exercise Goal BMI under 27 Refilled for 1 year  Likely sore throat is allergic Solumedrol 125mg  IM given in office today Follow up as needed if symptoms change or worsen or persist  Return in about 1 year (around 12/23/2024).    Rachael Chavira, PA-C

## 2024-01-20 ENCOUNTER — Ambulatory Visit (INDEPENDENT_AMBULATORY_CARE_PROVIDER_SITE_OTHER): Admitting: Obstetrics & Gynecology

## 2024-01-20 ENCOUNTER — Encounter: Payer: Self-pay | Admitting: Obstetrics & Gynecology

## 2024-01-20 ENCOUNTER — Other Ambulatory Visit (HOSPITAL_COMMUNITY)
Admission: RE | Admit: 2024-01-20 | Discharge: 2024-01-20 | Disposition: A | Discharge status: Home / Self Care | Source: Ambulatory Visit | Attending: Obstetrics & Gynecology | Admitting: Obstetrics & Gynecology

## 2024-01-20 VITALS — BP 131/86 | HR 94 | Ht 64.0 in | Wt 195.0 lb

## 2024-01-20 DIAGNOSIS — E559 Vitamin D deficiency, unspecified: Secondary | ICD-10-CM

## 2024-01-20 DIAGNOSIS — Z01419 Encounter for gynecological examination (general) (routine) without abnormal findings: Secondary | ICD-10-CM | POA: Diagnosis not present

## 2024-01-20 DIAGNOSIS — Z1331 Encounter for screening for depression: Secondary | ICD-10-CM | POA: Diagnosis not present

## 2024-01-20 NOTE — Progress Notes (Signed)
 Last pap smear (date and result):12/01/2020 Atypical Squamous Cells Last mammogram (date and result):n/a Last colon screening (date and result):n/a Brush:yes Floss:yes Seatbelts: yes Sunscreen: yes

## 2024-01-20 NOTE — Progress Notes (Signed)
 Subjective:     Rachael [name] is a 38 y.o. female here for a routine exam.  Current complaints: none--loves Mirena .     Gynecologic History No LMP recorded (lmp unknown). (Menstrual status: IUD). Contraception: tubal ligation Last Pap: 2022. Results were: ASC H; colpos showed CIN 1; so paps in our record since 2022 Last mammogram: n/a  Obstetric History OB History  Gravida Para Term Preterm AB Living  2 2 2   2   SAB IAB Ectopic Multiple Live Births     0 2    # Outcome Date GA Lbr Len/2nd Weight Sex Type Anes PTL Lv  2 Term 04/26/18 [redacted]w[redacted]d 08:40 / 01:37 7 lb 9.5 oz (3.445 kg) M Vag-Spont EPI  LIV  1 Term 06/26/14 [redacted]w[redacted]d 12:20 / 03:56 8 lb 2 oz (3.685 kg) M Vag-Spont EPI  LIV     The following portions of the patient's history were reviewed and updated as appropriate: allergies, current medications, past family history, past medical history, past social history, past surgical history, and problem list.  Review of Systems Pertinent items noted in HPI and remainder of comprehensive ROS otherwise negative.    Objective:     Vitals:   01/20/24 0830  BP: 131/86  Pulse: 94  Weight: 195 lb (88.5 kg)  Height: 5\' 4"  (1.626 m)   Vitals:  WNL General appearance: alert, cooperative and no distress  HEENT: Normocephalic, without obvious abnormality, atraumatic Eyes: negative Throat: lips, mucosa, and tongue normal; teeth and gums normal  Respiratory: Clear to auscultation bilaterally  CV: Regular rate and rhythm  Breasts:  Normal appearance, no masses or tenderness, no nipple retraction or dimpling  GI: Soft, non-tender; bowel sounds normal; no masses,  no organomegaly  GU: External Genitalia:  Tanner V, no lesion Urethra:  No prolapse   Vagina: Pink, normal rugae, no blood or discharge  Cervix: No CMT, no lesion, strings short and visible  Uterus:  Normal size and contour, non tender  Adnexa: Normal, no masses, non tender  Musculoskeletal: No edema, redness or tenderness in  the calves or thighs  Skin: No lesions or rash  Lymphatic: Axillary adenopathy: none     Psychiatric: Normal mood and behavior        Assessment:    Healthy female exam.    Plan:    1. Pap with co testing 2.  Mammogram at 41 3.  Mirena --place 2022 4.  Family hx updated--genetic test negative

## 2024-01-22 LAB — CYTOLOGY - PAP
Comment: NEGATIVE
Diagnosis: UNDETERMINED — AB
High risk HPV: NEGATIVE

## 2024-02-16 IMAGING — DX DG KNEE COMPLETE 4+V*L*
4 series · 4 of 4 positions shown · non-contrast
Comparison: None Available.

CLINICAL DATA: Knee pain.

EXAM:
LEFT KNEE - COMPLETE 4+ VIEW

[knee lat]
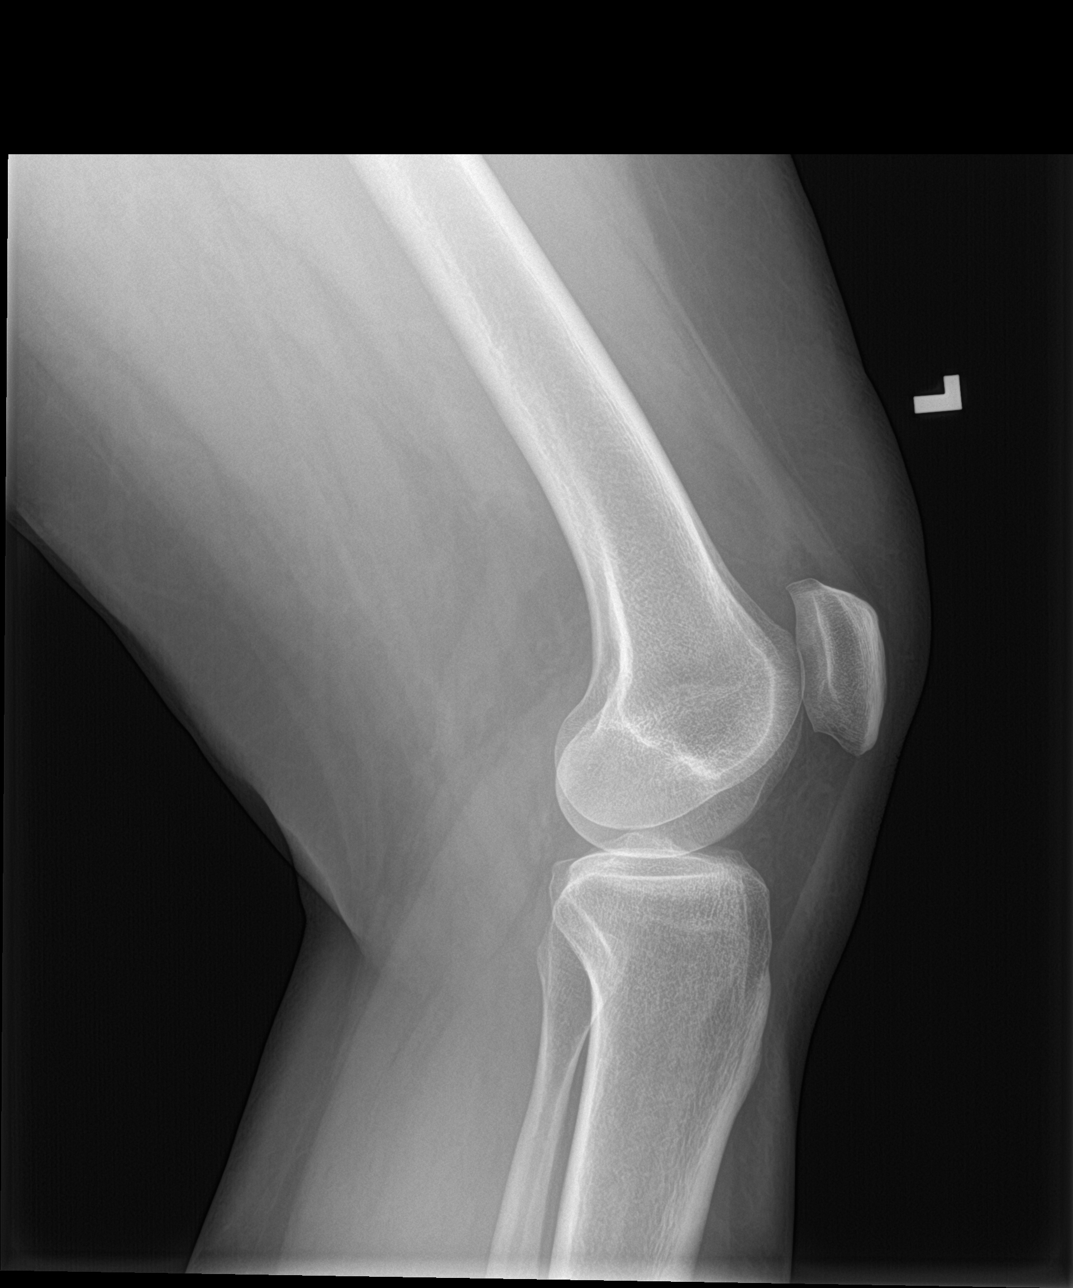

[knee ap bilat standing]
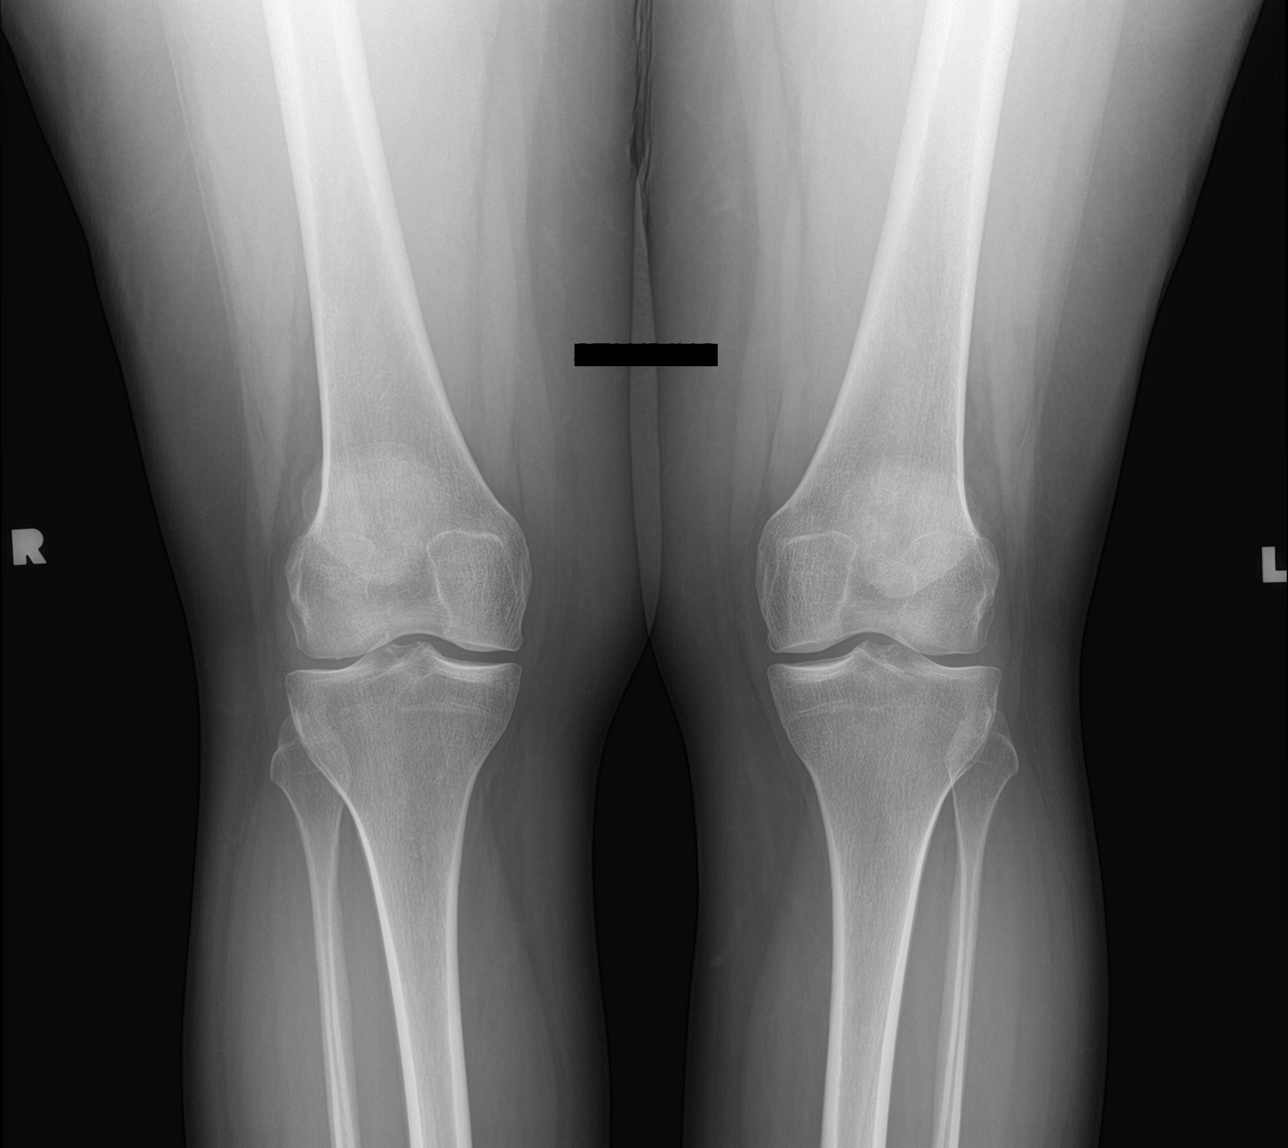

[knee sunrise standing]
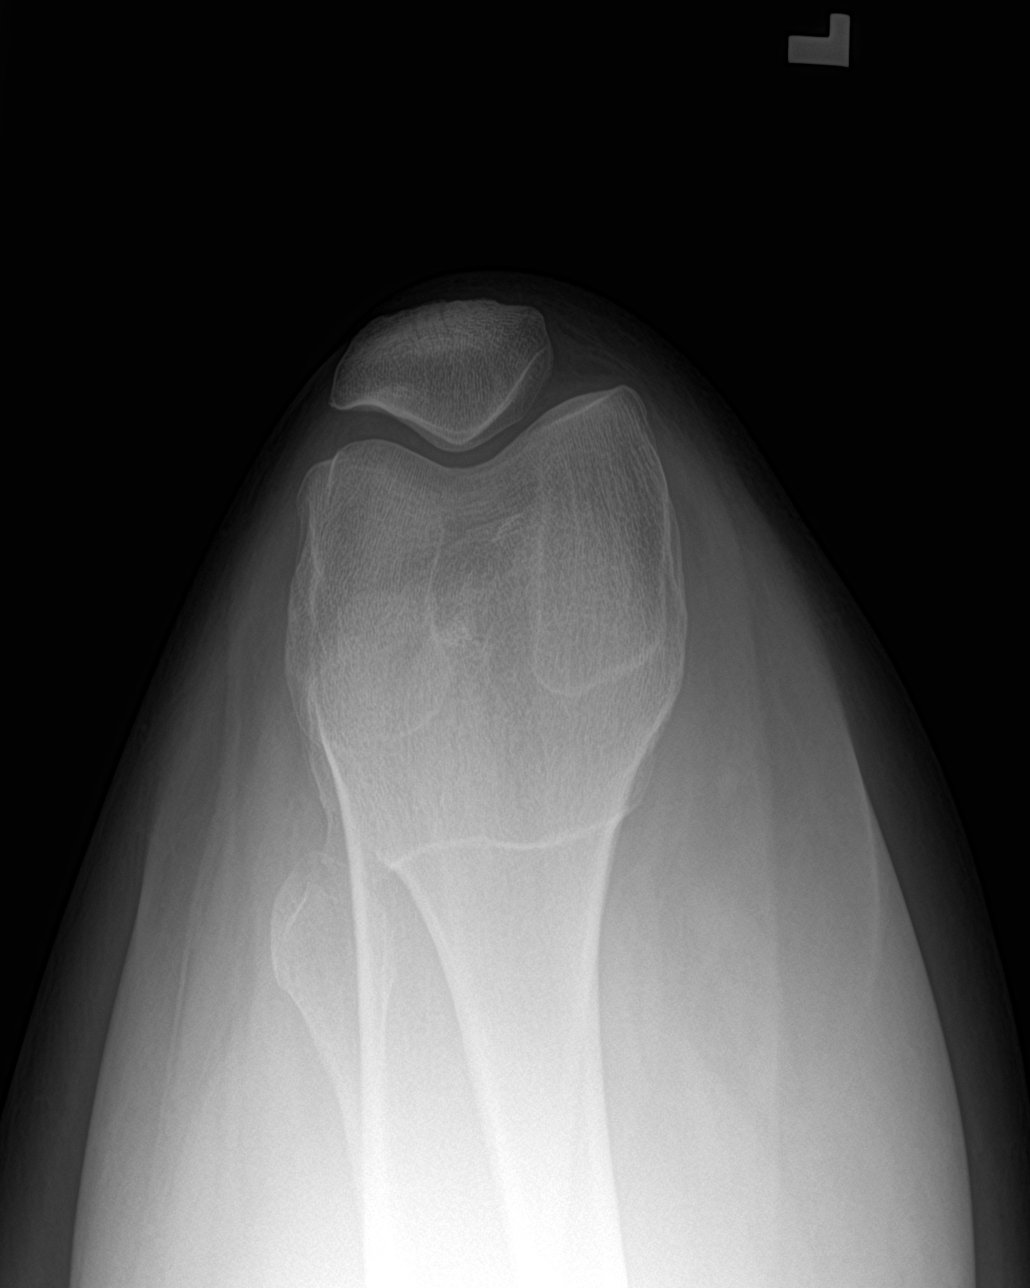

[knee tunnel]
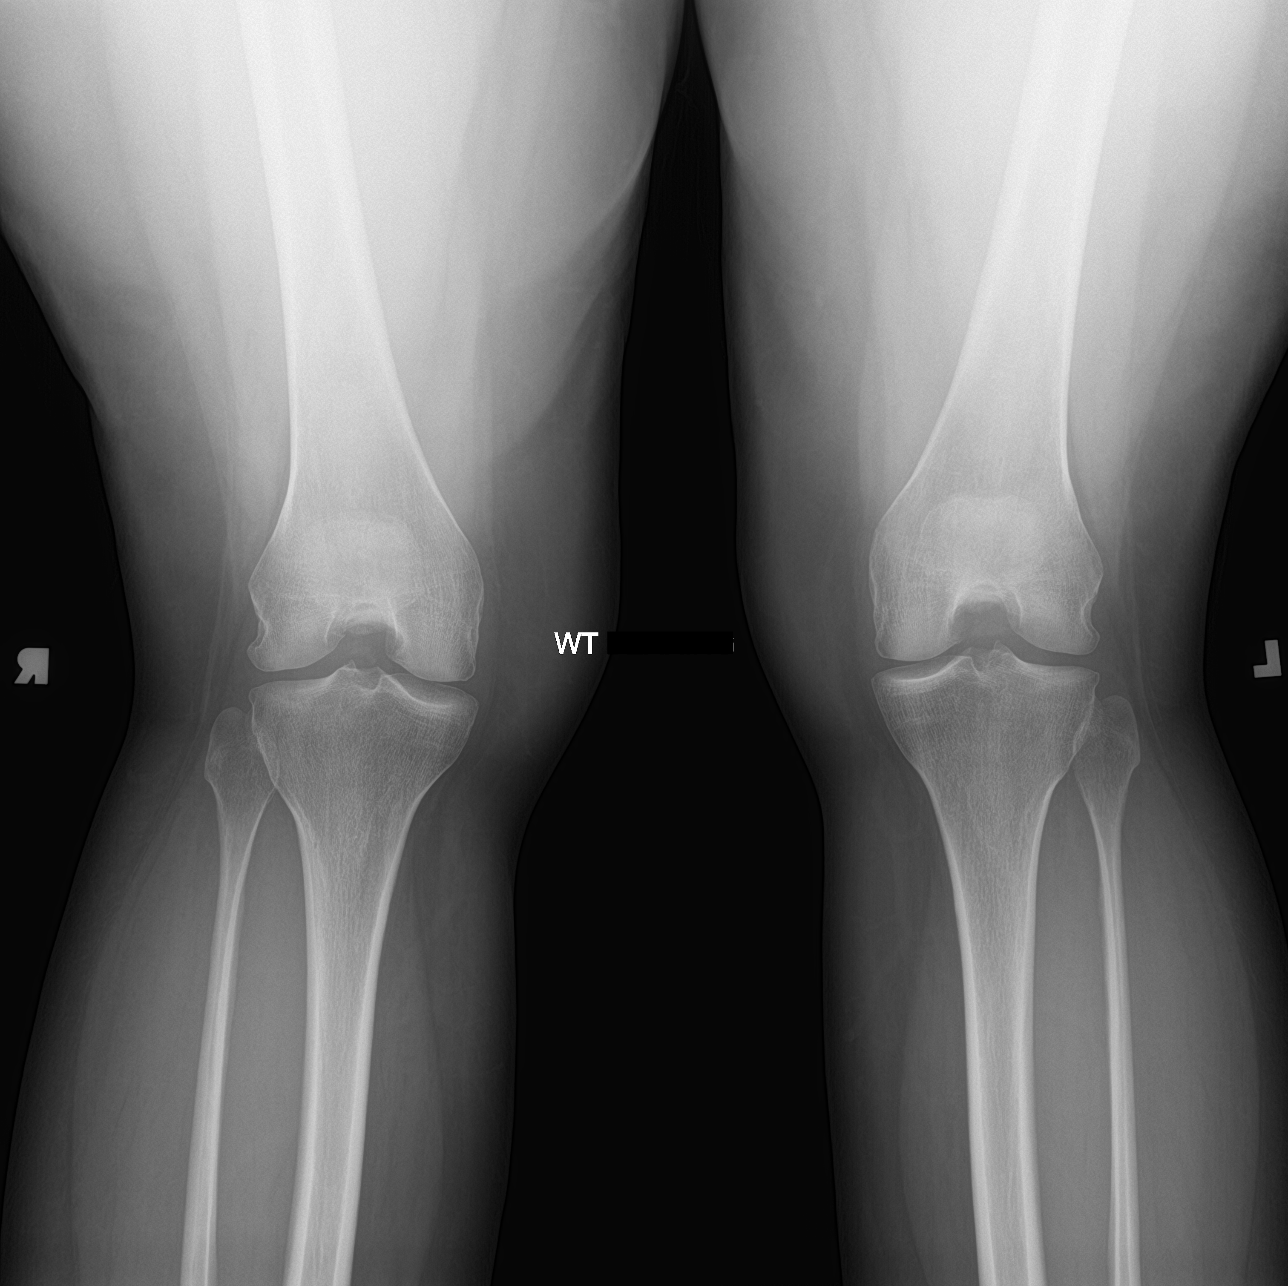

[4 of 4 positions shown; findings below may reference images not displayed]

FINDINGS: No evidence of fracture, dislocation, or joint effusion. No evidence
of arthropathy or other focal bone abnormality. Soft tissues are
unremarkable.
IMPRESSION: Negative.

## 2024-02-16 IMAGING — DX DG KNEE COMPLETE 4+V*R*
4 series · 4 of 4 positions shown · non-contrast
Comparison: None Available.

CLINICAL DATA: Knee pain.

EXAM:
RIGHT KNEE - COMPLETE 4+ VIEW

[knee lat]
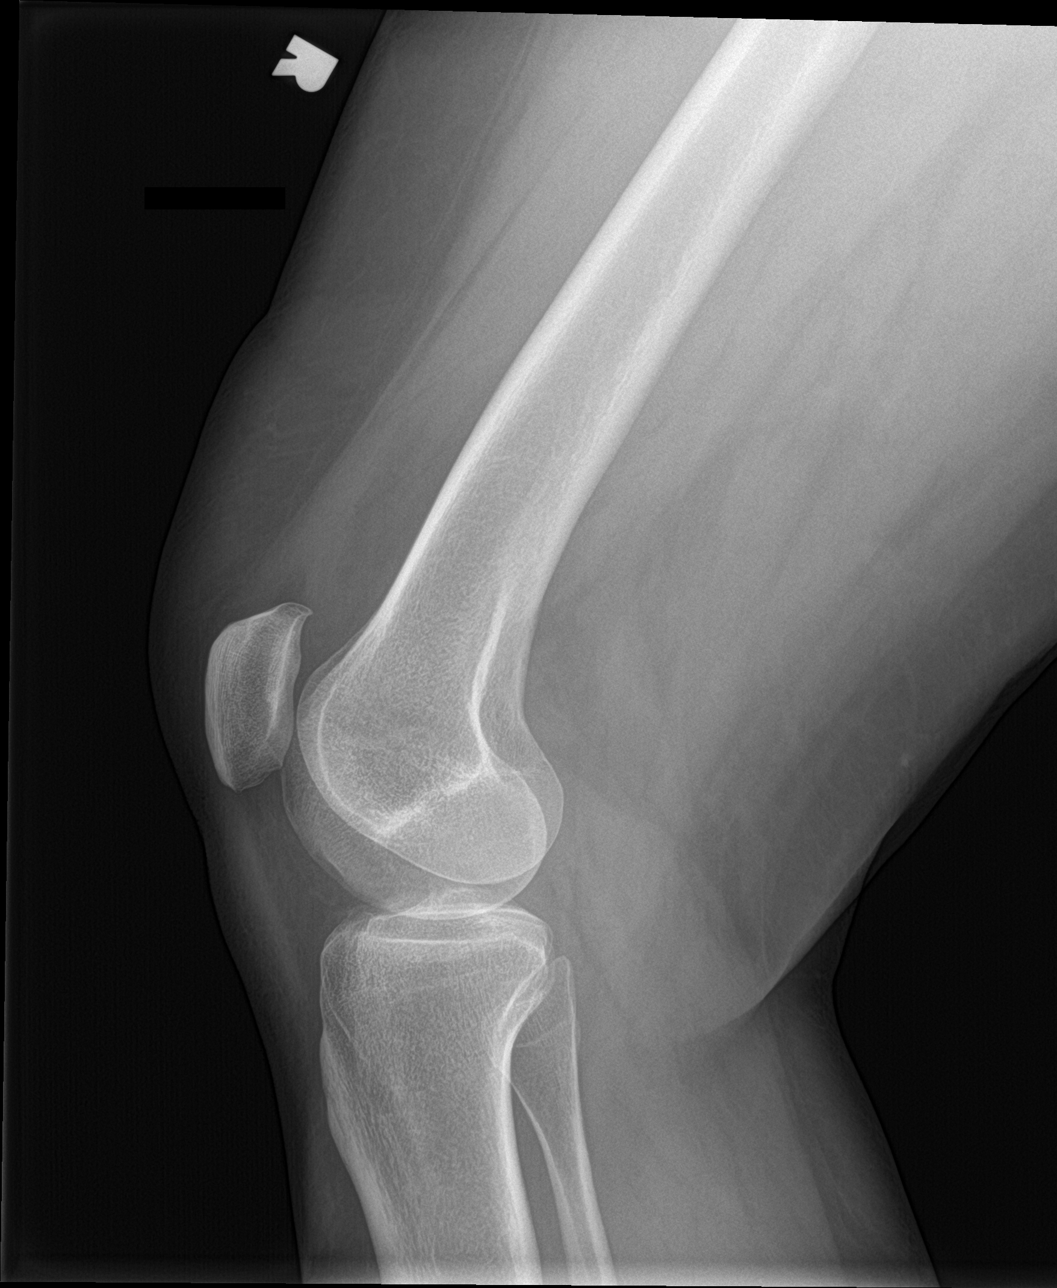

[knee ap bilat standing]
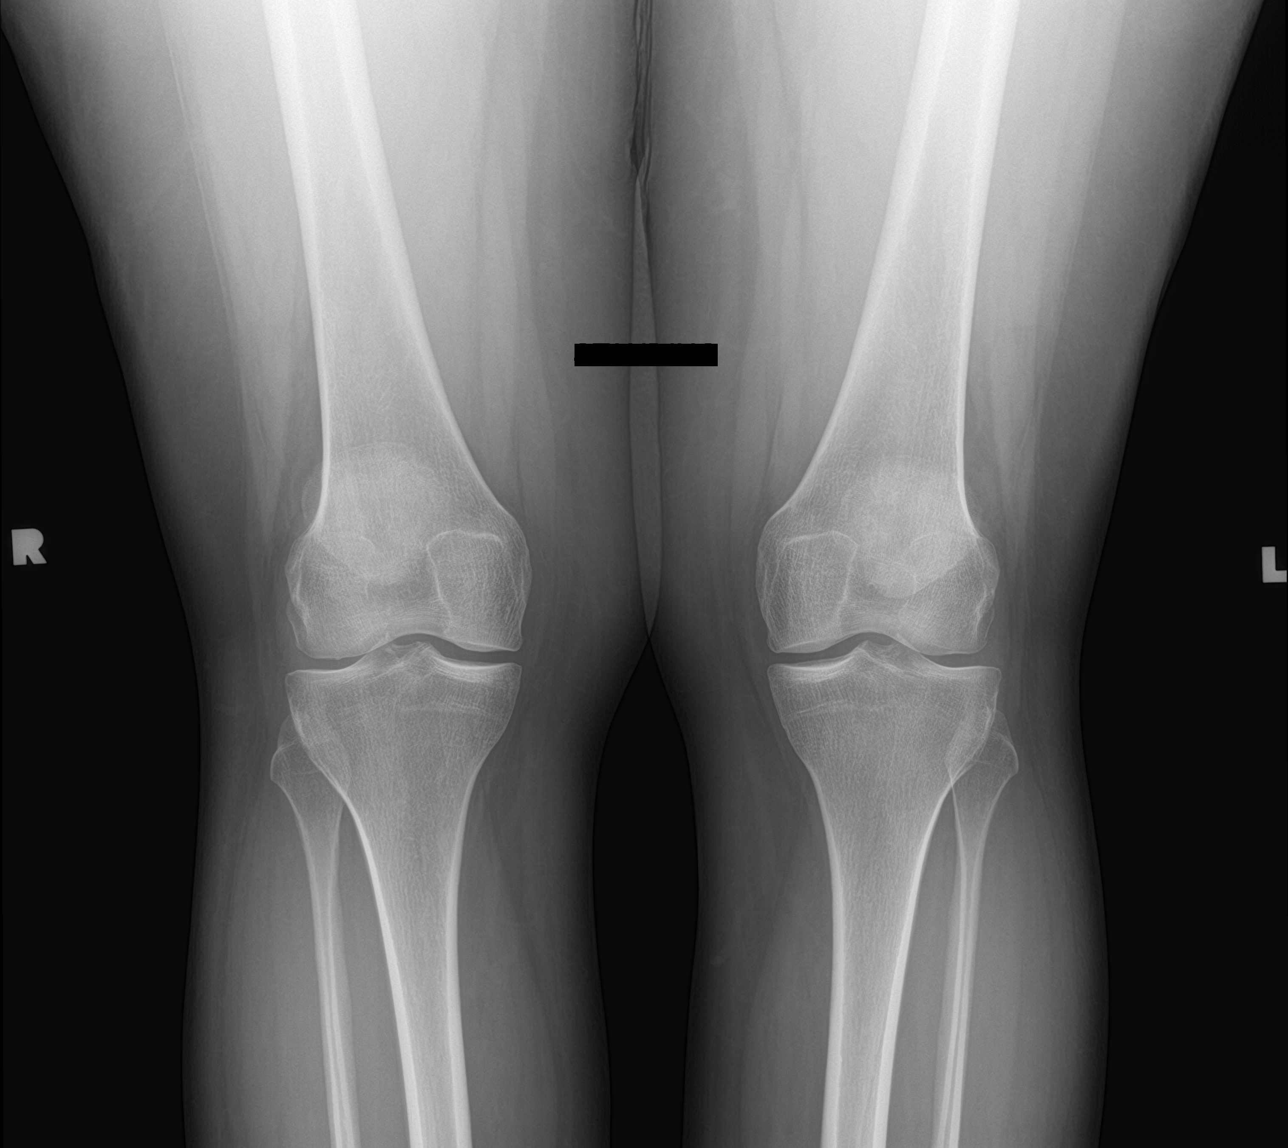

[knee sunrise standing]
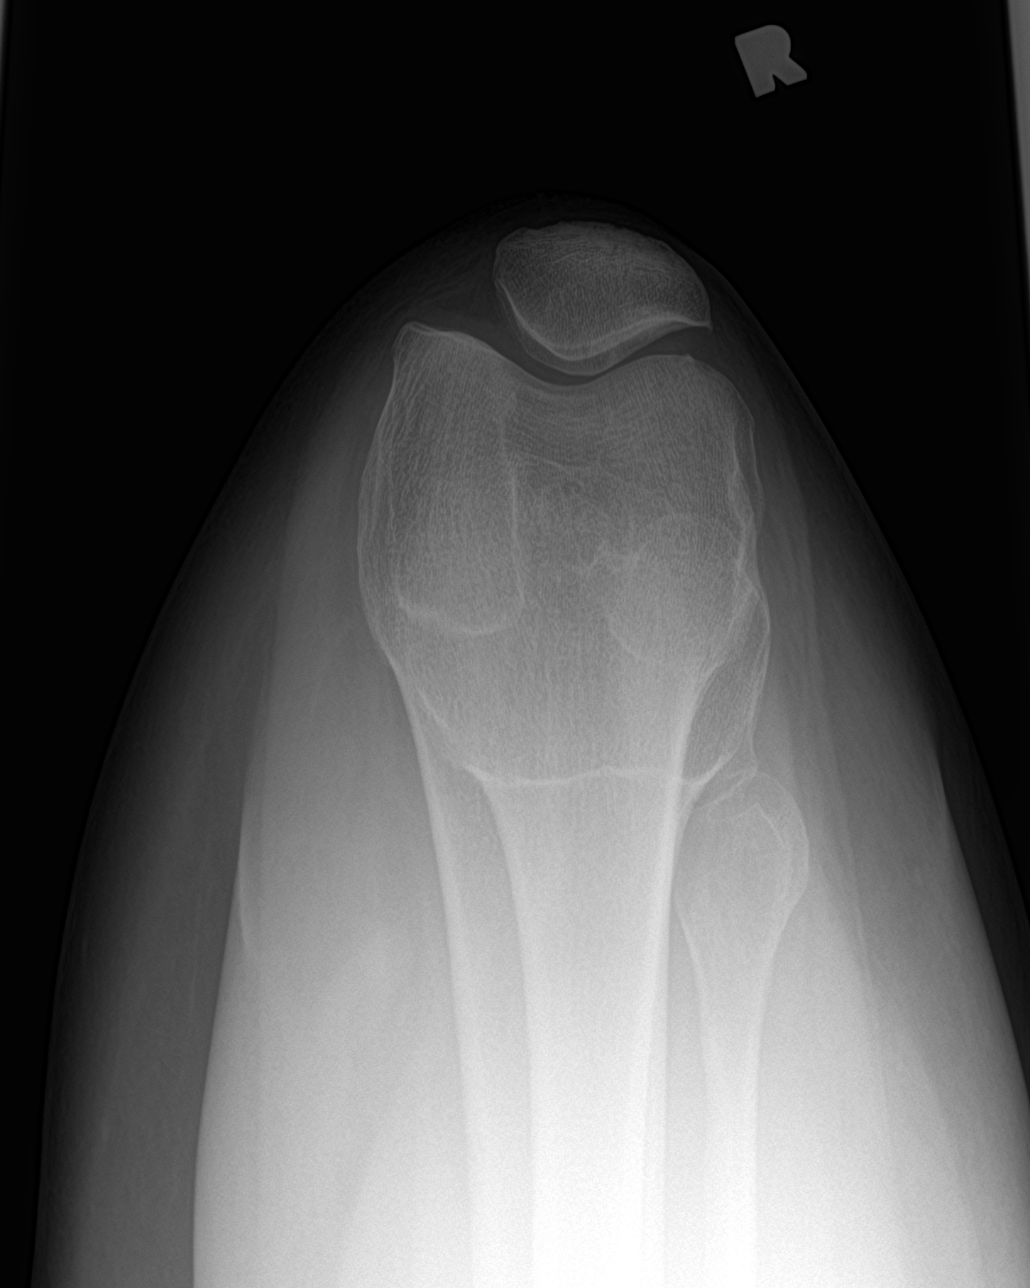

[knee tunnel]
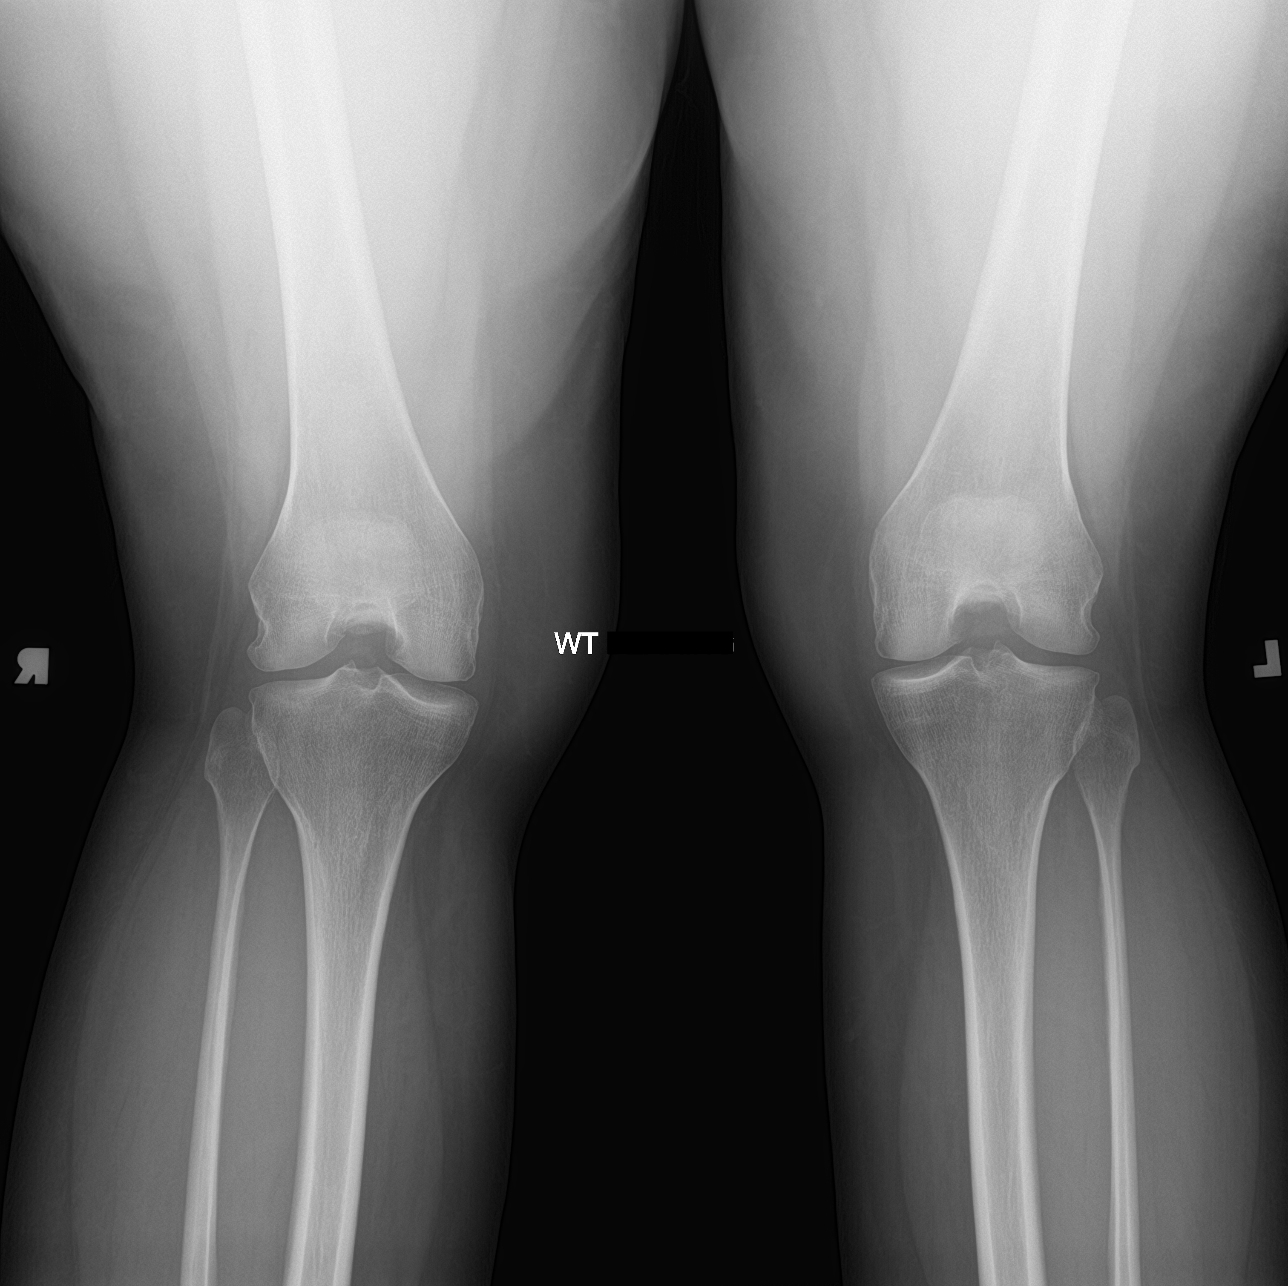

[4 of 4 positions shown; findings below may reference images not displayed]

FINDINGS: No evidence of fracture, dislocation, or joint effusion. No evidence
of arthropathy or other focal bone abnormality. Soft tissues are
unremarkable.
IMPRESSION: Negative.

## 2024-02-17 ENCOUNTER — Ambulatory Visit: Payer: Self-pay | Admitting: Obstetrics & Gynecology

## 2024-04-03 ENCOUNTER — Encounter: Payer: Self-pay | Admitting: Obstetrics & Gynecology

## 2024-04-07 ENCOUNTER — Encounter: Payer: Self-pay | Admitting: Physician Assistant

## 2024-04-07 NOTE — Telephone Encounter (Signed)
 Did not see a physical with Jade Breeback but in chart patient did have  a welll women's visit with OB- Gyn in May 25.

## 2024-04-08 ENCOUNTER — Other Ambulatory Visit: Payer: Self-pay | Admitting: Physician Assistant

## 2024-04-08 DIAGNOSIS — F419 Anxiety disorder, unspecified: Secondary | ICD-10-CM

## 2024-04-08 DIAGNOSIS — Z01419 Encounter for gynecological examination (general) (routine) without abnormal findings: Secondary | ICD-10-CM | POA: Diagnosis not present

## 2024-04-08 DIAGNOSIS — Z566 Other physical and mental strain related to work: Secondary | ICD-10-CM

## 2024-04-09 LAB — COMPREHENSIVE METABOLIC PANEL WITH GFR
ALT: 33 IU/L — ABNORMAL HIGH (ref 0–32)
AST: 15 IU/L (ref 0–40)
Albumin: 4.4 g/dL (ref 3.9–4.9)
Alkaline Phosphatase: 60 IU/L (ref 44–121)
BUN/Creatinine Ratio: 21 (ref 9–23)
BUN: 14 mg/dL (ref 6–20)
Bilirubin Total: 0.4 mg/dL (ref 0.0–1.2)
CO2: 21 mmol/L (ref 20–29)
Calcium: 9.3 mg/dL (ref 8.7–10.2)
Chloride: 102 mmol/L (ref 96–106)
Creatinine, Ser: 0.68 mg/dL (ref 0.57–1.00)
Globulin, Total: 2.7 g/dL (ref 1.5–4.5)
Glucose: 81 mg/dL (ref 70–99)
Potassium: 4.4 mmol/L (ref 3.5–5.2)
Sodium: 139 mmol/L (ref 134–144)
Total Protein: 7.1 g/dL (ref 6.0–8.5)
eGFR: 115 mL/min/1.73 (ref >59)

## 2024-04-09 LAB — CBC
Hematocrit: 41.1 % (ref 34.0–46.6)
Hemoglobin: 13.5 g/dL (ref 11.1–15.9)
MCH: 29.1 pg (ref 26.6–33.0)
MCHC: 32.8 g/dL (ref 31.5–35.7)
MCV: 89 fL (ref 79–97)
Platelets: 278 x10E3/uL (ref 150–450)
RBC: 4.64 x10E6/uL (ref 3.77–5.28)
RDW: 12.7 % (ref 11.7–15.4)
WBC: 7.3 x10E3/uL (ref 3.4–10.8)

## 2024-04-09 LAB — LIPID PANEL
Chol/HDL Ratio: 2.8 ratio (ref 0.0–4.4)
Cholesterol, Total: 144 mg/dL (ref 100–199)
HDL: 52 mg/dL (ref >39)
LDL Chol Calc (NIH): 68 mg/dL (ref 0–99)
Triglycerides: 135 mg/dL (ref 0–149)
VLDL Cholesterol Cal: 24 mg/dL (ref 5–40)

## 2024-04-09 LAB — VITAMIN D 25 HYDROXY (VIT D DEFICIENCY, FRACTURES): Vit D, 25-Hydroxy: 40.9 ng/mL (ref 30.0–100.0)

## 2024-04-09 LAB — TSH: TSH: 1.77 u[IU]/mL (ref 0.450–4.500)

## 2024-04-10 ENCOUNTER — Encounter: Payer: Self-pay | Admitting: Physician Assistant

## 2024-04-10 DIAGNOSIS — R7401 Elevation of levels of liver transaminase levels: Secondary | ICD-10-CM

## 2024-04-20 ENCOUNTER — Ambulatory Visit (INDEPENDENT_AMBULATORY_CARE_PROVIDER_SITE_OTHER)

## 2024-04-20 DIAGNOSIS — K8020 Calculus of gallbladder without cholecystitis without obstruction: Secondary | ICD-10-CM | POA: Diagnosis not present

## 2024-04-20 DIAGNOSIS — R7401 Elevation of levels of liver transaminase levels: Secondary | ICD-10-CM

## 2024-04-20 DIAGNOSIS — N200 Calculus of kidney: Secondary | ICD-10-CM | POA: Diagnosis not present

## 2024-04-28 ENCOUNTER — Ambulatory Visit: Payer: Self-pay | Admitting: Physician Assistant

## 2024-04-28 DIAGNOSIS — K8020 Calculus of gallbladder without cholecystitis without obstruction: Secondary | ICD-10-CM | POA: Insufficient documentation

## 2024-04-28 NOTE — Progress Notes (Signed)
 Rachael [name],   She does have a 1.1cm gallstone but no gallbladder thickening. This could cause some problems in the future but if not obstructing no reason for any intervention.  Liver looks good.  Non obstructing kidney stone in the right kidney.

## 2024-05-05 ENCOUNTER — Encounter: Payer: Self-pay | Admitting: Sports Medicine

## 2024-05-19 ENCOUNTER — Encounter: Payer: Self-pay | Admitting: Physician Assistant

## 2024-05-20 MED ORDER — NALTREXONE HCL 50 MG PO TABS: ORAL_TABLET | ORAL | 1 refills | Status: AC

## 2024-05-20 MED ORDER — BUPROPION HCL ER (SR) 200 MG PO TB12: 200.0000 mg | ORAL_TABLET | Freq: Two times a day (BID) | ORAL | 1 refills | Status: AC

## 2024-05-20 NOTE — Addendum Note (Signed)
 Addended by: ANTONIETTE VERMELL CROME on: 05/20/2024 04:43 PM   Modules accepted: Orders

## 2024-08-10 DIAGNOSIS — M62830 Muscle spasm of back: Secondary | ICD-10-CM | POA: Diagnosis not present

## 2024-08-10 DIAGNOSIS — M25552 Pain in left hip: Secondary | ICD-10-CM | POA: Diagnosis not present

## 2024-08-10 DIAGNOSIS — M9902 Segmental and somatic dysfunction of thoracic region: Secondary | ICD-10-CM | POA: Diagnosis not present

## 2024-08-10 DIAGNOSIS — M9904 Segmental and somatic dysfunction of sacral region: Secondary | ICD-10-CM | POA: Diagnosis not present

## 2024-08-10 DIAGNOSIS — M5417 Radiculopathy, lumbosacral region: Secondary | ICD-10-CM | POA: Diagnosis not present

## 2024-08-10 DIAGNOSIS — M9903 Segmental and somatic dysfunction of lumbar region: Secondary | ICD-10-CM | POA: Diagnosis not present

## 2024-08-12 DIAGNOSIS — M5417 Radiculopathy, lumbosacral region: Secondary | ICD-10-CM | POA: Diagnosis not present

## 2024-08-12 DIAGNOSIS — M25552 Pain in left hip: Secondary | ICD-10-CM | POA: Diagnosis not present

## 2024-08-12 DIAGNOSIS — M9902 Segmental and somatic dysfunction of thoracic region: Secondary | ICD-10-CM | POA: Diagnosis not present

## 2024-08-12 DIAGNOSIS — M9904 Segmental and somatic dysfunction of sacral region: Secondary | ICD-10-CM | POA: Diagnosis not present

## 2024-08-12 DIAGNOSIS — M9903 Segmental and somatic dysfunction of lumbar region: Secondary | ICD-10-CM | POA: Diagnosis not present

## 2024-08-12 DIAGNOSIS — M62830 Muscle spasm of back: Secondary | ICD-10-CM | POA: Diagnosis not present

## 2024-08-20 DIAGNOSIS — M62830 Muscle spasm of back: Secondary | ICD-10-CM | POA: Diagnosis not present

## 2024-08-20 DIAGNOSIS — M9904 Segmental and somatic dysfunction of sacral region: Secondary | ICD-10-CM | POA: Diagnosis not present

## 2024-08-20 DIAGNOSIS — M9903 Segmental and somatic dysfunction of lumbar region: Secondary | ICD-10-CM | POA: Diagnosis not present

## 2024-08-20 DIAGNOSIS — M9902 Segmental and somatic dysfunction of thoracic region: Secondary | ICD-10-CM | POA: Diagnosis not present

## 2024-08-20 DIAGNOSIS — M5417 Radiculopathy, lumbosacral region: Secondary | ICD-10-CM | POA: Diagnosis not present

## 2024-08-20 DIAGNOSIS — M25552 Pain in left hip: Secondary | ICD-10-CM | POA: Diagnosis not present

## 2024-08-28 ENCOUNTER — Telehealth: Admitting: Family Medicine

## 2024-08-28 DIAGNOSIS — R21 Rash and other nonspecific skin eruption: Secondary | ICD-10-CM

## 2024-08-28 MED ORDER — NYSTATIN 100000 UNIT/GM EX CREA: 1.0000 | TOPICAL_CREAM | Freq: Two times a day (BID) | CUTANEOUS | 0 refills | Status: AC

## 2024-08-28 NOTE — Progress Notes (Signed)
 E Visit for Rash  We are sorry that you are not feeling well. Here is how we plan to help!   Based upon your presentation it appears you have a fungal infection.  I have prescribed: and Nystatin  cream apply to the affected area twice daily for 10-14 days   HOME CARE:  Take cool showers and avoid direct sunlight. Apply cool compress or wet dressings. Take a bath in an oatmeal bath.  Sprinkle content of one Aveeno packet under running faucet with comfortably warm water.  Bathe for 15-20 minutes, 1-2 times daily.  Pat dry with a towel. Do not rub the rash. Use hydrocortisone cream. Take an antihistamine like Benadryl  for widespread rashes that itch.  The adult dose of Benadryl  is 25-50 mg by mouth 4 times daily. Caution:  This type of medication may cause sleepiness.  Do not drink alcohol, drive, or operate dangerous machinery while taking antihistamines.  Do not take these medications if you have prostate enlargement.  Read package instructions thoroughly on all medications that you take.  GET HELP RIGHT AWAY IF:  Symptoms don't go away after treatment. Severe itching that persists. If you rash spreads or swells. If you rash begins to smell. If it blisters and opens or develops a yellow-brown crust. You develop a fever. You have a sore throat. You become short of breath.  MAKE SURE YOU:  Understand these instructions. Will watch your condition. Will get help right away if you are not doing well or get worse.  Thank you for choosing an e-visit. Your e-visit answers were reviewed by a board certified advanced clinical practitioner to complete your personal care plan. Depending upon the condition, your plan could have included both over the counter or prescription medications. Please review your pharmacy choice. Be sure that the pharmacy you have chosen is open so that you can pick up your prescription now.  If there is a problem you may message your provider in MyChart to have the  prescription routed to another pharmacy. Your safety is important to us . If you have drug allergies check your prescription carefully.  For the next 24 hours, you can use MyChart to ask questions about todays visit, request a non-urgent call back, or ask for a work or school excuse from your e-visit provider. You will get an email in the next two days asking about your experience. I hope that your e-visit has been valuable and will speed your recovery.  I have spent 5 minutes in review of e-visit questionnaire, review and updating patient chart, medical decision making and response to patient.   Roosvelt Mater, PA-C
